# Patient Record
Sex: Female | Born: 1970
Health system: Southern US, Community
[De-identification: ages and names within clinical notes are randomized; demographics above are authoritative.]

## PROBLEM LIST (undated history)

## (undated) DIAGNOSIS — I10 Essential (primary) hypertension: Secondary | ICD-10-CM

## (undated) HISTORY — PX: MYOMECTOMY: SHX85

## (undated) HISTORY — PX: TUBAL LIGATION: SHX77

## (undated) HISTORY — PX: UTERINE FIBROID SURGERY: SHX826

---

## 2008-06-02 ENCOUNTER — Emergency Department (HOSPITAL_BASED_OUTPATIENT_CLINIC_OR_DEPARTMENT_OTHER): Admission: EM | Admit: 2008-06-02 | Discharge: 2008-06-02 | Payer: Self-pay | Admitting: Emergency Medicine

## 2008-06-02 ENCOUNTER — Ambulatory Visit: Payer: Self-pay | Admitting: Diagnostic Radiology

## 2009-04-06 ENCOUNTER — Emergency Department (HOSPITAL_BASED_OUTPATIENT_CLINIC_OR_DEPARTMENT_OTHER): Admission: EM | Admit: 2009-04-06 | Discharge: 2009-04-06 | Payer: Self-pay | Admitting: Emergency Medicine

## 2009-04-06 ENCOUNTER — Ambulatory Visit: Payer: Self-pay | Admitting: Diagnostic Radiology

## 2009-04-13 ENCOUNTER — Emergency Department (HOSPITAL_BASED_OUTPATIENT_CLINIC_OR_DEPARTMENT_OTHER): Admission: EM | Admit: 2009-04-13 | Discharge: 2009-04-13 | Payer: Self-pay | Admitting: Emergency Medicine

## 2009-10-29 ENCOUNTER — Emergency Department (HOSPITAL_BASED_OUTPATIENT_CLINIC_OR_DEPARTMENT_OTHER): Admission: EM | Admit: 2009-10-29 | Discharge: 2009-10-29 | Payer: Self-pay | Admitting: Emergency Medicine

## 2010-03-18 ENCOUNTER — Emergency Department (HOSPITAL_BASED_OUTPATIENT_CLINIC_OR_DEPARTMENT_OTHER)
Admission: EM | Admit: 2010-03-18 | Discharge: 2010-03-18 | Payer: Self-pay | Source: Home / Self Care | Admitting: Emergency Medicine

## 2010-06-16 LAB — PREGNANCY, URINE: Preg Test, Ur: NEGATIVE

## 2010-06-16 LAB — WET PREP, GENITAL: Yeast Wet Prep HPF POC: NONE SEEN

## 2010-06-16 LAB — URINALYSIS, ROUTINE W REFLEX MICROSCOPIC
Bilirubin Urine: NEGATIVE
Glucose, UA: NEGATIVE mg/dL
Ketones, ur: NEGATIVE mg/dL
pH: 7.5 (ref 5.0–8.0)

## 2010-06-16 LAB — GC/CHLAMYDIA PROBE AMP, GENITAL: Chlamydia, DNA Probe: NEGATIVE

## 2010-06-20 LAB — URINALYSIS, ROUTINE W REFLEX MICROSCOPIC
Bilirubin Urine: NEGATIVE
Glucose, UA: NEGATIVE mg/dL
Hgb urine dipstick: NEGATIVE
Ketones, ur: NEGATIVE mg/dL
Nitrite: NEGATIVE
Protein, ur: NEGATIVE mg/dL
Specific Gravity, Urine: 1.017 (ref 1.005–1.030)
Urobilinogen, UA: 0.2 mg/dL (ref 0.0–1.0)
pH: 7 (ref 5.0–8.0)

## 2010-06-20 LAB — LIPASE, BLOOD: Lipase: 87 U/L (ref 23–300)

## 2010-06-20 LAB — DIFFERENTIAL
Basophils Absolute: 0.1 10*3/uL (ref 0.0–0.1)
Basophils Relative: 2 % — ABNORMAL HIGH (ref 0–1)
Lymphocytes Relative: 33 % (ref 12–46)
Monocytes Absolute: 0.4 10*3/uL (ref 0.1–1.0)
Neutrophils Relative %: 53 % (ref 43–77)

## 2010-06-20 LAB — WET PREP, GENITAL
Trich, Wet Prep: NONE SEEN
Yeast Wet Prep HPF POC: NONE SEEN

## 2010-06-20 LAB — CBC
Hemoglobin: 13.8 g/dL (ref 12.0–15.0)
MCHC: 32.2 g/dL (ref 30.0–36.0)
MCV: 92.1 fL (ref 78.0–100.0)
RBC: 4.64 MIL/uL (ref 3.87–5.11)

## 2010-06-20 LAB — COMPREHENSIVE METABOLIC PANEL
AST: 25 U/L (ref 0–37)
CO2: 24 mEq/L (ref 19–32)
Calcium: 9.3 mg/dL (ref 8.4–10.5)
Creatinine, Ser: 0.8 mg/dL (ref 0.4–1.2)
GFR calc Af Amer: >60 mL/min (ref >60)
GFR calc non Af Amer: >60 mL/min (ref >60)
Glucose, Bld: 87 mg/dL (ref 70–99)
Total Protein: 9 g/dL — ABNORMAL HIGH (ref 6.0–8.3)

## 2010-06-20 LAB — PREGNANCY, URINE: Preg Test, Ur: NEGATIVE

## 2010-06-20 LAB — GC/CHLAMYDIA PROBE AMP, GENITAL: Chlamydia, DNA Probe: NEGATIVE

## 2010-06-21 LAB — URINALYSIS, ROUTINE W REFLEX MICROSCOPIC
Ketones, ur: NEGATIVE mg/dL
Nitrite: NEGATIVE
Nitrite: NEGATIVE
Protein, ur: NEGATIVE mg/dL
Specific Gravity, Urine: 1.023 (ref 1.005–1.030)
Urobilinogen, UA: 0.2 mg/dL (ref 0.0–1.0)
Urobilinogen, UA: 1 mg/dL (ref 0.0–1.0)
pH: 6.5 (ref 5.0–8.0)
pH: 6.5 (ref 5.0–8.0)

## 2010-06-21 LAB — DIFFERENTIAL
Basophils Relative: 0 % (ref 0–1)
Eosinophils Absolute: 0.1 10*3/uL (ref 0.0–0.7)
Eosinophils Relative: 2 % (ref 0–5)
Monocytes Relative: 12 % (ref 3–12)
Neutrophils Relative %: 55 % (ref 43–77)

## 2010-06-21 LAB — WET PREP, GENITAL: Yeast Wet Prep HPF POC: NONE SEEN

## 2010-06-21 LAB — CBC
HCT: 38.6 % (ref 36.0–46.0)
Hemoglobin: 12.8 g/dL (ref 12.0–15.0)
RBC: 4.16 MIL/uL (ref 3.87–5.11)
WBC: 3.6 10*3/uL — ABNORMAL LOW (ref 4.0–10.5)

## 2010-06-21 LAB — GC/CHLAMYDIA PROBE AMP, GENITAL
Chlamydia, DNA Probe: NEGATIVE
GC Probe Amp, Genital: NEGATIVE

## 2010-06-21 LAB — BASIC METABOLIC PANEL
CO2: 27 mEq/L (ref 19–32)
Calcium: 8.8 mg/dL (ref 8.4–10.5)
Chloride: 102 mEq/L (ref 96–112)
GFR calc Af Amer: >60 mL/min (ref >60)
Glucose, Bld: 68 mg/dL — ABNORMAL LOW (ref 70–99)
Sodium: 141 mEq/L (ref 135–145)

## 2010-06-21 LAB — PREGNANCY, URINE: Preg Test, Ur: NEGATIVE

## 2010-07-21 LAB — URINALYSIS, ROUTINE W REFLEX MICROSCOPIC
Leukocytes, UA: NEGATIVE
Nitrite: NEGATIVE
Protein, ur: NEGATIVE mg/dL
Urobilinogen, UA: 1 mg/dL (ref 0.0–1.0)

## 2010-07-21 LAB — URINE MICROSCOPIC-ADD ON

## 2011-01-02 IMAGING — CR DG CHEST 2V
2 series · 2 of 2 positions shown · non-contrast
Comparison: None

CLINICAL DATA: Cough, shortness of breath and fever.

CHEST - 2 VIEW

[w chest pa]
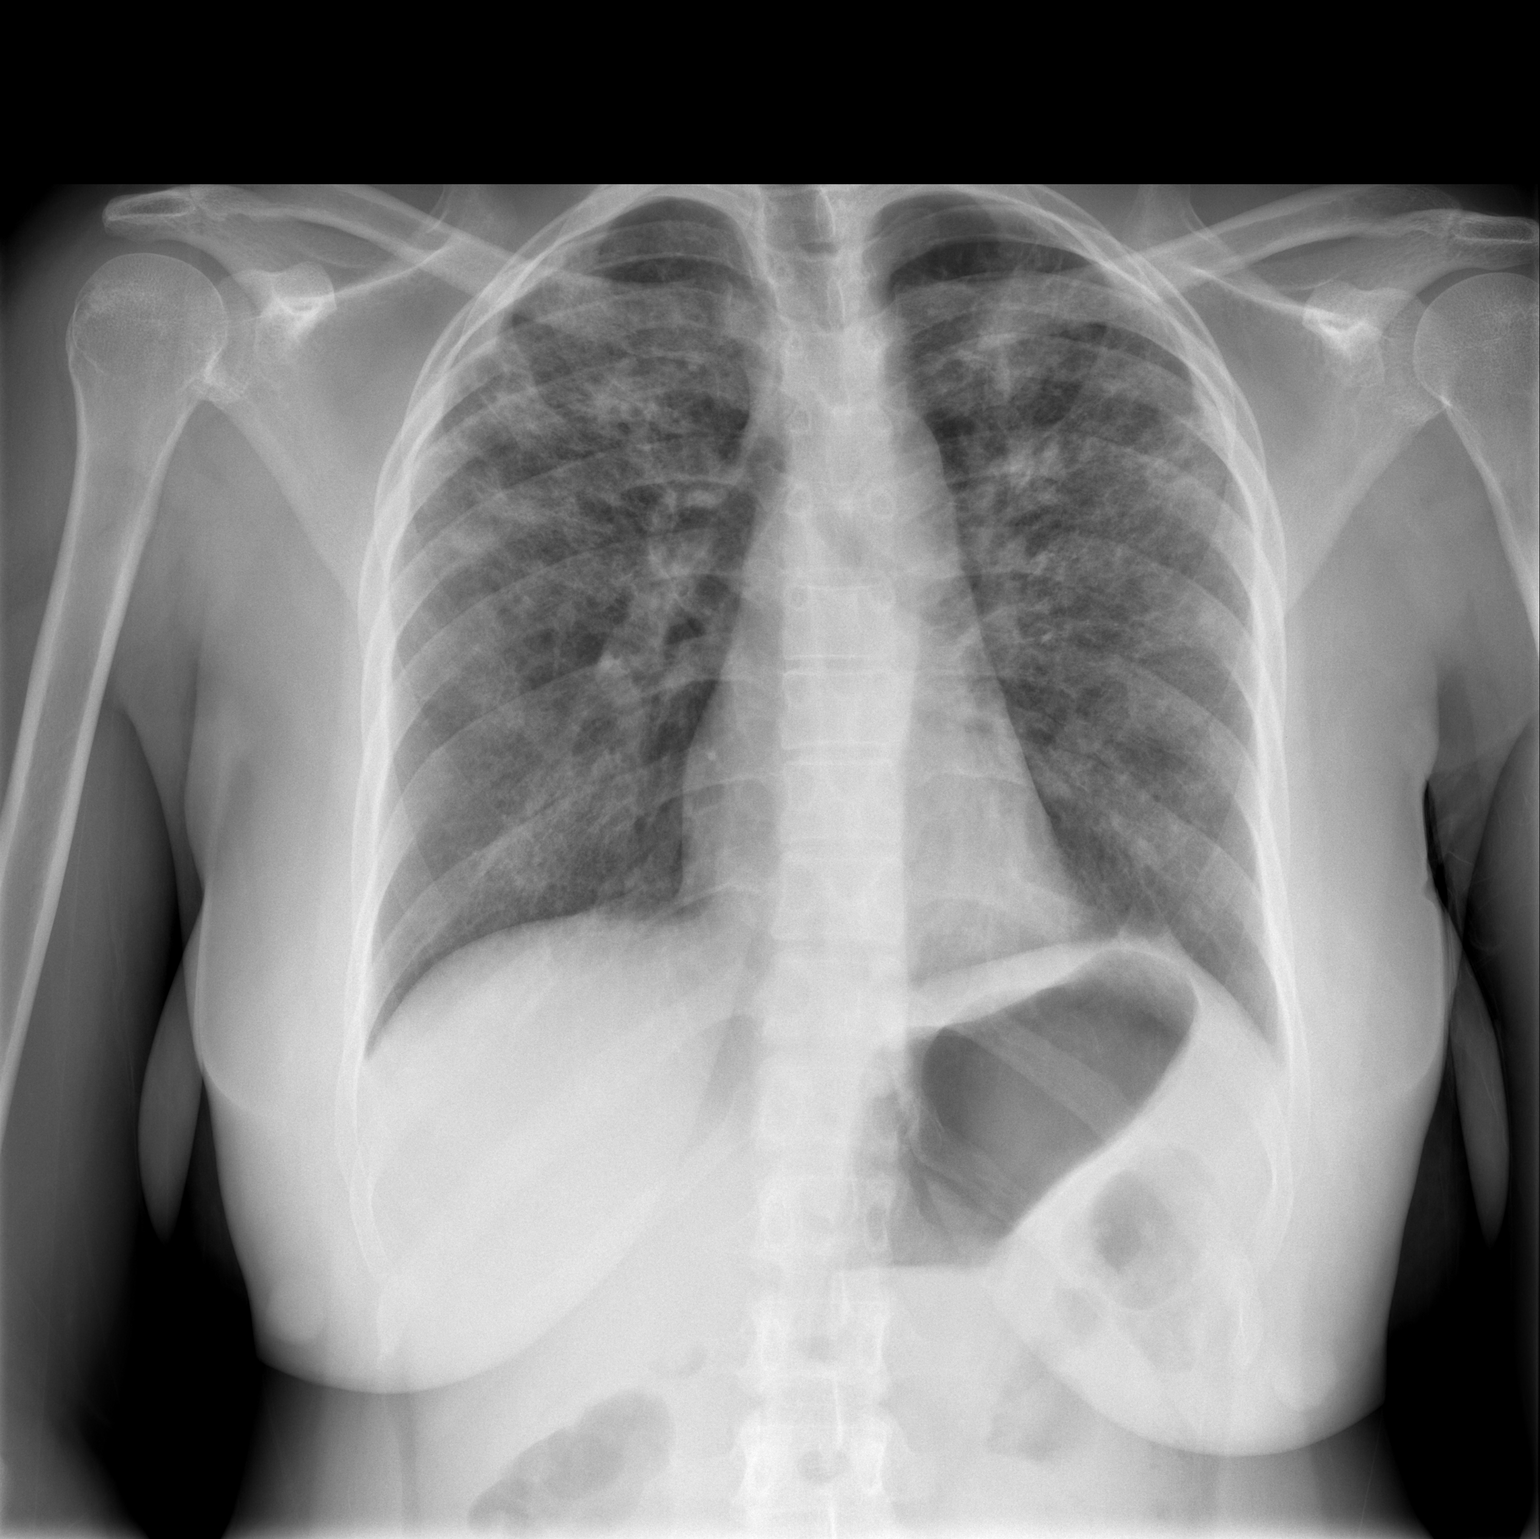

[w chest lat]
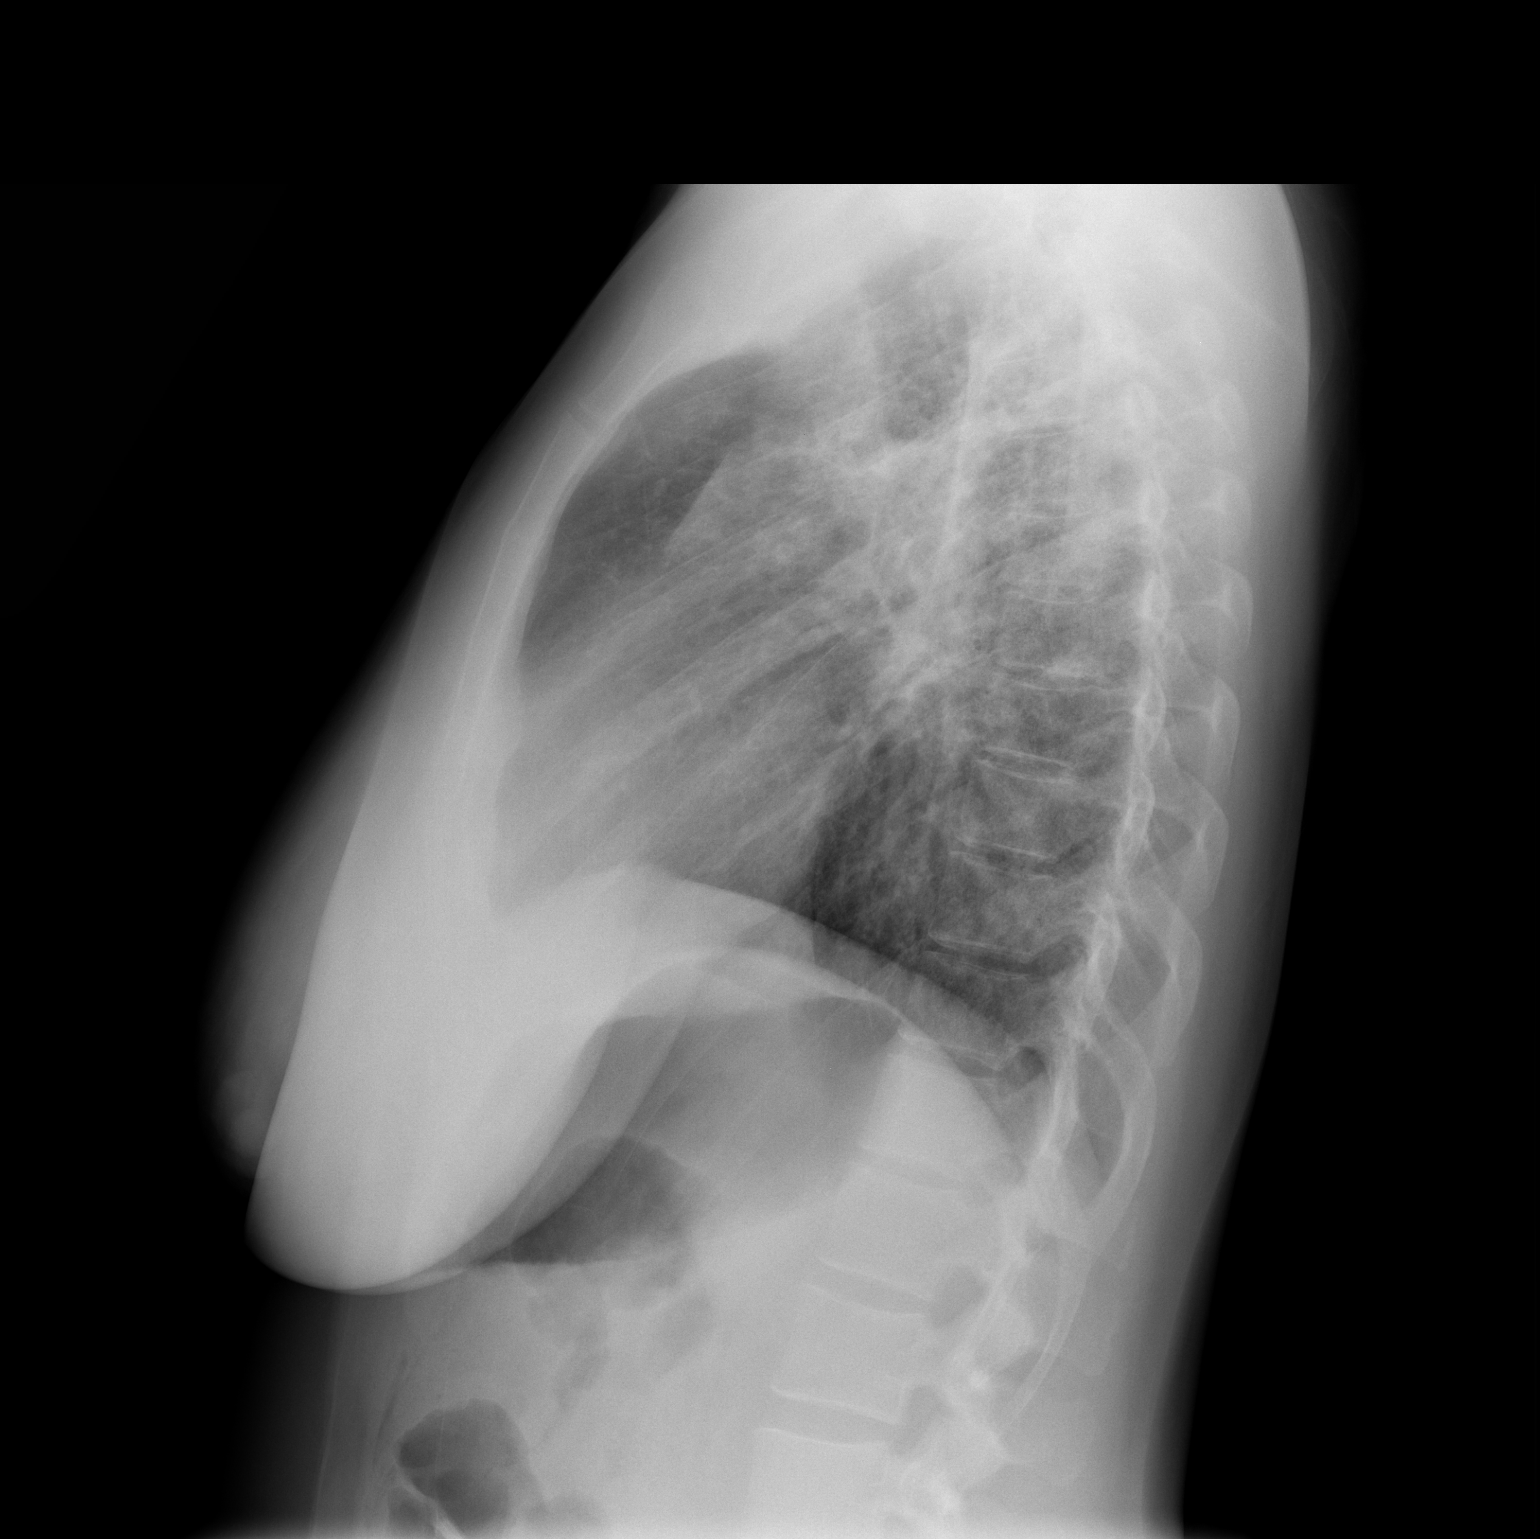

[2 of 2 positions shown; findings below may reference images not displayed]

FINDINGS: The cardiac silhouette, mediastinal and hilar contours
are within normal limits.  Bilateral upper lobe predominant
interstitial and airspace process may represent an atypical
pneumonia.  No pleural effusions or pulmonary edema.  The bony
thorax is intact.
IMPRESSION: Bilateral, upper lobe predominant, interstitial and airspace
process may reflect an atypical pneumonia.

## 2011-01-08 ENCOUNTER — Emergency Department (HOSPITAL_BASED_OUTPATIENT_CLINIC_OR_DEPARTMENT_OTHER)
Admission: EM | Admit: 2011-01-08 | Discharge: 2011-01-08 | Disposition: A | Discharge status: Home / Self Care | Payer: Medicaid Other | Attending: Emergency Medicine | Admitting: Emergency Medicine

## 2011-01-08 DIAGNOSIS — M549 Dorsalgia, unspecified: Secondary | ICD-10-CM

## 2011-01-08 DIAGNOSIS — F17200 Nicotine dependence, unspecified, uncomplicated: Secondary | ICD-10-CM | POA: Insufficient documentation

## 2011-01-08 LAB — BASIC METABOLIC PANEL
BUN: 10 mg/dL (ref 6–23)
CO2: 27 mEq/L (ref 19–32)
Calcium: 8.9 mg/dL (ref 8.4–10.5)
Chloride: 103 mEq/L (ref 96–112)
Creatinine, Ser: 0.8 mg/dL (ref 0.50–1.10)
GFR calc Af Amer: >90 mL/min (ref >90)
GFR calc non Af Amer: >90 mL/min (ref >90)
Glucose, Bld: 89 mg/dL (ref 70–99)
Potassium: 4 mEq/L (ref 3.5–5.1)
Sodium: 137 mEq/L (ref 135–145)

## 2011-01-08 LAB — URINALYSIS, ROUTINE W REFLEX MICROSCOPIC
Bilirubin Urine: NEGATIVE
Glucose, UA: NEGATIVE mg/dL
Ketones, ur: NEGATIVE mg/dL
Leukocytes, UA: NEGATIVE
pH: 7 (ref 5.0–8.0)

## 2011-01-08 MED ORDER — IBUPROFEN 400 MG PO TABS
400.0000 mg | ORAL_TABLET | Freq: Once | ORAL | Status: AC
  Administered 2011-01-08: 400 mg via ORAL
  Filled 2011-01-08: qty 1

## 2011-01-08 MED ORDER — OXYCODONE-ACETAMINOPHEN 5-325 MG PO TABS
1.0000 | ORAL_TABLET | Freq: Once | ORAL | Status: AC
  Administered 2011-01-08: 1 via ORAL
  Filled 2011-01-08: qty 1

## 2011-01-08 MED ORDER — DIAZEPAM 5 MG PO TABS
5.0000 mg | ORAL_TABLET | Freq: Once | ORAL | Status: AC
  Administered 2011-01-08: 5 mg via ORAL
  Filled 2011-01-08: qty 1

## 2011-01-08 MED ORDER — CYCLOBENZAPRINE HCL 10 MG PO TABS: 10.0000 mg | ORAL_TABLET | Freq: Two times a day (BID) | ORAL | Status: AC | PRN

## 2011-01-08 NOTE — ED Provider Notes (Signed)
History   40yF with R lower back pain. Onset this morning when coughing while standing in kitchen. Persistent since then. R low back without radiation. Worse with movement. No urinary complaints. No fever or chills. NO rash. No numbness, tingling or loss of strength. No abdominal pain. No n/v. Felt fine prior to event.  CSN: [id number] Arrival date & time: 01/08/2011  3:20 PM  Chief Complaint  Patient presents with  . Back Pain    (Consider location/radiation/quality/duration/timing/severity/associated sxs/prior treatment) HPI  History reviewed. No pertinent past medical history.  Past Surgical History  Procedure Date  . Uterine fibroid surgery     No family history on file.  History  Substance Use Topics  . Smoking status: Current Everyday Smoker  . Smokeless tobacco: Not on file  . Alcohol Use: No    OB History    Grav Para Term Preterm Abortions TAB SAB Ect Mult Living                  Review of Systems  All other systems reviewed and are negative.    Allergies  Review of patient's allergies indicates no known allergies.  Home Medications   Current Outpatient Rx  Name Route Sig Dispense Refill  . BC HEADACHE POWDER PO Oral Take 1 packet by mouth every 6 (six) hours as needed. For pain     . IBUPROFEN 200 MG PO TABS Oral Take 800 mg by mouth every 6 (six) hours as needed. For pain       BP 189/104  Pulse 75  Temp(Src) 98.5 F (36.9 C) (Oral)  Resp 18  Ht 5\' 4"  (1.626 m)  Wt 170 lb 4 oz (77.225 kg)  BMI 29.22 kg/m2  SpO2 98%  LMP 12/19/2010  Physical Exam  Constitutional: She appears well-developed and well-nourished.  HENT:  Head: Normocephalic and atraumatic.  Neck: Normal range of motion. Neck supple.  Cardiovascular: Normal rate, regular rhythm and normal heart sounds.  Exam reveals no friction rub.   No murmur heard. Pulmonary/Chest: Effort normal and breath sounds normal. No respiratory distress. She has no wheezes. She exhibits no  tenderness.  Abdominal: Soft. She exhibits no distension and no mass. There is no tenderness.  Musculoskeletal: Normal range of motion. She exhibits no edema and no tenderness.       Back pain not reproducble. No skin lesions noted.   Neurological: She is alert. No cranial nerve deficit. She exhibits normal muscle tone. Coordination normal.       Gait normal appearing and without evdience of ataxia. Strength 5/5 b/l LE. Sensation grossly intact to light touch.  Skin: Skin is warm and dry.  Psychiatric: Her behavior is normal. Thought content normal.    ED Course  Procedures (including critical care time)   Labs Reviewed  URINALYSIS, ROUTINE W REFLEX MICROSCOPIC  PREGNANCY, URINE  BASIC METABOLIC PANEL     No results found.   No diagnosis found.    MDM  40yF with atraumatic R lower back pain. Possible sprain/strain.  Consider ureteral colic or uti. Symptoms somewhat atypical and UA not suggestive. Location not consistent with biliary colic. Not pregnant. Also consider ovarian torsion but clinicl suspicion low. Plan symptomatic tx and outpt fu.        Raeford Razor, MD 01/15/11 1056

## 2011-01-08 NOTE — ED Notes (Signed)
Lower back pain started this am while standing-denies injury

## 2011-07-09 ENCOUNTER — Other Ambulatory Visit: Payer: Self-pay

## 2011-07-13 ENCOUNTER — Other Ambulatory Visit (HOSPITAL_COMMUNITY): Payer: Self-pay | Admitting: Obstetrics and Gynecology

## 2011-07-13 DIAGNOSIS — Z3682 Encounter for antenatal screening for nuchal translucency: Secondary | ICD-10-CM

## 2011-07-15 ENCOUNTER — Encounter (HOSPITAL_COMMUNITY): Payer: Self-pay

## 2011-07-15 ENCOUNTER — Other Ambulatory Visit (HOSPITAL_COMMUNITY): Payer: Self-pay | Admitting: Obstetrics and Gynecology

## 2011-07-15 ENCOUNTER — Ambulatory Visit (HOSPITAL_COMMUNITY)
Admission: RE | Admit: 2011-07-15 | Discharge: 2011-07-15 | Disposition: A | Discharge status: Home / Self Care | Payer: Medicaid Other | Source: Ambulatory Visit | Attending: Obstetrics and Gynecology | Admitting: Obstetrics and Gynecology

## 2011-07-15 ENCOUNTER — Other Ambulatory Visit: Payer: Self-pay | Admitting: Maternal and Fetal Medicine

## 2011-07-15 DIAGNOSIS — O09529 Supervision of elderly multigravida, unspecified trimester: Secondary | ICD-10-CM

## 2011-07-15 DIAGNOSIS — O3510X0 Maternal care for (suspected) chromosomal abnormality in fetus, unspecified, not applicable or unspecified: Secondary | ICD-10-CM | POA: Insufficient documentation

## 2011-07-15 DIAGNOSIS — Z3682 Encounter for antenatal screening for nuchal translucency: Secondary | ICD-10-CM

## 2011-07-15 DIAGNOSIS — Z3689 Encounter for other specified antenatal screening: Secondary | ICD-10-CM | POA: Insufficient documentation

## 2011-07-15 DIAGNOSIS — O3490 Maternal care for abnormality of pelvic organ, unspecified, unspecified trimester: Secondary | ICD-10-CM | POA: Insufficient documentation

## 2011-07-15 DIAGNOSIS — O99330 Smoking (tobacco) complicating pregnancy, unspecified trimester: Secondary | ICD-10-CM | POA: Insufficient documentation

## 2011-07-15 DIAGNOSIS — O351XX0 Maternal care for (suspected) chromosomal abnormality in fetus, not applicable or unspecified: Secondary | ICD-10-CM | POA: Insufficient documentation

## 2011-09-14 ENCOUNTER — Encounter (HOSPITAL_BASED_OUTPATIENT_CLINIC_OR_DEPARTMENT_OTHER): Payer: Self-pay | Admitting: *Deleted

## 2011-09-14 ENCOUNTER — Emergency Department (HOSPITAL_BASED_OUTPATIENT_CLINIC_OR_DEPARTMENT_OTHER)
Admission: EM | Admit: 2011-09-14 | Discharge: 2011-09-14 | Disposition: A | Discharge status: Home / Self Care | Payer: Medicaid Other | Attending: Emergency Medicine | Admitting: Emergency Medicine

## 2011-09-14 DIAGNOSIS — R51 Headache: Secondary | ICD-10-CM | POA: Insufficient documentation

## 2011-09-14 DIAGNOSIS — K089 Disorder of teeth and supporting structures, unspecified: Secondary | ICD-10-CM | POA: Insufficient documentation

## 2011-09-14 DIAGNOSIS — K0889 Other specified disorders of teeth and supporting structures: Secondary | ICD-10-CM

## 2011-09-14 DIAGNOSIS — F17200 Nicotine dependence, unspecified, uncomplicated: Secondary | ICD-10-CM | POA: Insufficient documentation

## 2011-09-14 MED ORDER — CLINDAMYCIN HCL 150 MG PO CAPS: 150.0000 mg | ORAL_CAPSULE | Freq: Four times a day (QID) | ORAL | Status: AC

## 2011-09-14 NOTE — ED Provider Notes (Signed)
History     CSN: [id number]  Arrival date & time 09/14/11  1432   First MD Initiated Contact with Patient 09/14/11 1444      Chief Complaint  Patient presents with  . Facial Pain    (Consider location/radiation/quality/duration/timing/severity/associated sxs/prior treatment) HPI Comments: Pt states that she has had right sided pain facial and ear pain for 1 week:pt states that she was seen and started on amoxicillin:pt states that she would like something more for pain:pt states that has not had fever  The history is provided by the patient. No language interpreter was used.    History reviewed. No pertinent past medical history.  Past Surgical History  Procedure Date  . Uterine fibroid surgery   . Myomectomy     No family history on file.  History  Substance Use Topics  . Smoking status: Current Everyday Smoker  . Smokeless tobacco: Not on file  . Alcohol Use: No    OB History    Grav Para Term Preterm Abortions TAB SAB Ect Mult Living   3 1 1  0 1 1 0 0 0 1      Review of Systems  Constitutional: Negative.   Respiratory: Negative.   Cardiovascular: Negative.     Allergies  Review of patient's allergies indicates no known allergies.  Home Medications   Current Outpatient Rx  Name Route Sig Dispense Refill  . BC HEADACHE POWDER PO Oral Take 1 packet by mouth every 6 (six) hours as needed. For pain     . CLINDAMYCIN HCL 150 MG PO CAPS Oral Take 1 capsule (150 mg total) by mouth every 6 (six) hours. 28 capsule 0  . IBUPROFEN 200 MG PO TABS Oral Take 800 mg by mouth every 6 (six) hours as needed. For pain       BP 108/61  Pulse 67  Temp(Src) 98 F (36.7 C) (Oral)  Resp 18  SpO2 99%  Physical Exam  Nursing note reviewed. Constitutional: She appears well-developed and well-nourished.  HENT:  Right Ear: External ear normal.  Left Ear: External ear normal.       Right lower jaw tender with mild swelling  Eyes: Conjunctivae and EOM are normal.    Cardiovascular: Normal rate and regular rhythm.   Pulmonary/Chest: Effort normal and breath sounds normal.    ED Course  Procedures (including critical care time)  Labs Reviewed - No data to display No results found.   1. Pain, dental       MDM  Will treat for dental pain:will switch to clindamycin        Teressa Lower, NP 09/14/11 [phone]

## 2011-09-14 NOTE — Discharge Instructions (Signed)
Toothache Toothaches are usually caused by tooth decay (cavity). However, other causes of toothache include:  Gum disease.   Cracked tooth.   Cracked filling.   Injury.   Jaw problem (temporo mandibular joint or TMJ disorder).   Tooth abscess.   Root sensitivity.   Grinding.   Eruption problems.  Swelling and redness around a painful tooth often means you have a dental abscess. Pain medicine and antibiotics can help reduce symptoms, but you will need to see a dentist within the next few days to have your problem properly evaluated and treated. If tooth decay is the problem, you may need a filling or root canal to save your tooth. If the problem is more severe, your tooth may need to be pulled. SEEK IMMEDIATE MEDICAL CARE IF:  You cannot swallow.   You develop severe swelling, increased redness, or increased pain in your mouth or face.   You have a fever.   You cannot open your mouth adequately.  Document Released: 04/29/2004 Document Revised: 03/11/2011 Document Reviewed: 06/19/2009 [employer] Patient Information 2012 [employer]. 

## 2011-09-14 NOTE — ED Notes (Signed)
Pt. Is pregnant and has been taking tylenol for pain with no relief

## 2011-09-14 NOTE — ED Notes (Signed)
Facial swelling x 1 week. Pain started in her right ear then her throat and now into the right side of her neck. She is 5 months pregnant and can only take Tylenol for pain with no relief.

## 2011-09-16 NOTE — ED Provider Notes (Signed)
Medical screening examination/treatment/procedure(s) were performed by non-physician practitioner and as supervising physician I was immediately available for consultation/collaboration.  Laylamarie Meuser, MD 09/16/11 1506 

## 2014-02-04 ENCOUNTER — Encounter (HOSPITAL_BASED_OUTPATIENT_CLINIC_OR_DEPARTMENT_OTHER): Payer: Self-pay | Admitting: *Deleted

## 2014-06-04 ENCOUNTER — Emergency Department (HOSPITAL_BASED_OUTPATIENT_CLINIC_OR_DEPARTMENT_OTHER): Payer: Medicaid Other

## 2014-06-04 ENCOUNTER — Emergency Department (HOSPITAL_BASED_OUTPATIENT_CLINIC_OR_DEPARTMENT_OTHER)
Admission: EM | Admit: 2014-06-04 | Discharge: 2014-06-04 | Disposition: A | Discharge status: Home / Self Care | Payer: Medicaid Other | Attending: Emergency Medicine | Admitting: Emergency Medicine

## 2014-06-04 ENCOUNTER — Encounter (HOSPITAL_BASED_OUTPATIENT_CLINIC_OR_DEPARTMENT_OTHER): Payer: Self-pay | Admitting: Emergency Medicine

## 2014-06-04 DIAGNOSIS — S8002XA Contusion of left knee, initial encounter: Secondary | ICD-10-CM | POA: Diagnosis not present

## 2014-06-04 DIAGNOSIS — Y9389 Activity, other specified: Secondary | ICD-10-CM | POA: Diagnosis not present

## 2014-06-04 DIAGNOSIS — S8992XA Unspecified injury of left lower leg, initial encounter: Secondary | ICD-10-CM | POA: Diagnosis present

## 2014-06-04 DIAGNOSIS — Y998 Other external cause status: Secondary | ICD-10-CM | POA: Insufficient documentation

## 2014-06-04 DIAGNOSIS — Y92410 Unspecified street and highway as the place of occurrence of the external cause: Secondary | ICD-10-CM | POA: Insufficient documentation

## 2014-06-04 DIAGNOSIS — Z720 Tobacco use: Secondary | ICD-10-CM | POA: Insufficient documentation

## 2014-06-04 MED ORDER — NAPROXEN 500 MG PO TABS: 500.0000 mg | ORAL_TABLET | Freq: Two times a day (BID) | ORAL | Status: DC

## 2014-06-04 MED ORDER — IBUPROFEN 400 MG PO TABS
600.0000 mg | ORAL_TABLET | Freq: Once | ORAL | Status: AC
  Administered 2014-06-04: 600 mg via ORAL
  Filled 2014-06-04 (×2): qty 1
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
## 2014-06-04 NOTE — Discharge Instructions (Signed)
Keep knee elevated. Ice several times a day. Knee sleeve for support. Follow up with your doctor or urgent care if not improving.   Knee Pain The knee is the complex joint between your thigh and your lower leg. It is made up of bones, tendons, ligaments, and cartilage. The bones that make up the knee are:  The femur in the thigh.  The tibia and fibula in the lower leg.  The patella or kneecap riding in the groove on the lower femur. CAUSES  Knee pain is a common complaint with many causes. A few of these causes are:  Injury, such as:  A ruptured ligament or tendon injury.  Torn cartilage.  Medical conditions, such as:  Gout  Arthritis  Infections  Overuse, over training, or overdoing a physical activity. Knee pain can be minor or severe. Knee pain can accompany debilitating injury. Minor knee problems often respond well to self-care measures or get well on their own. More serious injuries may need medical intervention or even surgery. SYMPTOMS The knee is complex. Symptoms of knee problems can vary widely. Some of the problems are:  Pain with movement and weight bearing.  Swelling and tenderness.  Buckling of the knee.  Inability to straighten or extend your knee.  Your knee locks and you cannot straighten it.  Warmth and redness with pain and fever.  Deformity or dislocation of the kneecap. DIAGNOSIS  Determining what is wrong may be very straight forward such as when there is an injury. It can also be challenging because of the complexity of the knee. Tests to make a diagnosis may include:  Your caregiver taking a history and doing a physical exam.  Routine X-rays can be used to rule out other problems. X-rays will not reveal a cartilage tear. Some injuries of the knee can be diagnosed by:  Arthroscopy a surgical technique by which a small video camera is inserted through tiny incisions on the sides of the knee. This procedure is used to examine and repair  internal knee joint problems. Tiny instruments can be used during arthroscopy to repair the torn knee cartilage (meniscus).  Arthrography is a radiology technique. A contrast liquid is directly injected into the knee joint. Internal structures of the knee joint then become visible on X-ray film.  An MRI scan is a non X-ray radiology procedure in which magnetic fields and a computer produce two- or three-dimensional images of the inside of the knee. Cartilage tears are often visible using an MRI scanner. MRI scans have largely replaced arthrography in diagnosing cartilage tears of the knee.  Blood work.  Examination of the fluid that helps to lubricate the knee joint (synovial fluid). This is done by taking a sample out using a needle and a syringe. TREATMENT The treatment of knee problems depends on the cause. Some of these treatments are:  Depending on the injury, proper casting, splinting, surgery, or physical therapy care will be needed.  Give yourself adequate recovery time. Do not overuse your joints. If you begin to get sore during workout routines, back off. Slow down or do fewer repetitions.  For repetitive activities such as cycling or running, maintain your strength and nutrition.  Alternate muscle groups. For example, if you are a weight lifter, work the upper body on one day and the lower body the next.  Either tight or weak muscles do not give the proper support for your knee. Tight or weak muscles do not absorb the stress placed on the knee joint.  Keep the muscles surrounding the knee strong.  Take care of mechanical problems.  If you have flat feet, orthotics or special shoes may help. See your caregiver if you need help.  Arch supports, sometimes with wedges on the inner or outer aspect of the heel, can help. These can shift pressure away from the side of the knee most bothered by osteoarthritis.  A brace called an "unloader" brace also may be used to help ease the  pressure on the most arthritic side of the knee.  If your caregiver has prescribed crutches, braces, wraps or ice, use as directed. The acronym for this is PRICE. This means protection, rest, ice, compression, and elevation.  Nonsteroidal anti-inflammatory drugs (NSAIDs), can help relieve pain. But if taken immediately after an injury, they may actually increase swelling. Take NSAIDs with food in your stomach. Stop them if you develop stomach problems. Do not take these if you have a history of ulcers, stomach pain, or bleeding from the bowel. Do not take without your caregiver's approval if you have problems with fluid retention, heart failure, or kidney problems.  For ongoing knee problems, physical therapy may be helpful.  Glucosamine and chondroitin are over-the-counter dietary supplements. Both may help relieve the pain of osteoarthritis in the knee. These medicines are different from the usual anti-inflammatory drugs. Glucosamine may decrease the rate of cartilage destruction.  Injections of a corticosteroid drug into your knee joint may help reduce the symptoms of an arthritis flare-up. They may provide pain relief that lasts a few months. You may have to wait a few months between injections. The injections do have a small increased risk of infection, water retention, and elevated blood sugar levels.  Hyaluronic acid injected into damaged joints may ease pain and provide lubrication. These injections may work by reducing inflammation. A series of shots may give relief for as long as 6 months.  Topical painkillers. Applying certain ointments to your skin may help relieve the pain and stiffness of osteoarthritis. Ask your pharmacist for suggestions. Many over the-counter products are approved for temporary relief of arthritis pain.  In some countries, doctors often prescribe topical NSAIDs for relief of chronic conditions such as arthritis and tendinitis. A review of treatment with NSAID creams  found that they worked as well as oral medications but without the serious side effects. PREVENTION  Maintain a healthy weight. Extra pounds put more strain on your joints.  Get strong, stay limber. Weak muscles are a common cause of knee injuries. Stretching is important. Include flexibility exercises in your workouts.  Be smart about exercise. If you have osteoarthritis, chronic knee pain or recurring injuries, you may need to change the way you exercise. This does not mean you have to stop being active. If your knees ache after jogging or playing basketball, consider switching to swimming, water aerobics, or other low-impact activities, at least for a few days a week. Sometimes limiting high-impact activities will provide relief.  Make sure your shoes fit well. Choose footwear that is right for your sport.  Protect your knees. Use the proper gear for knee-sensitive activities. Use kneepads when playing volleyball or laying carpet. Buckle your seat belt every time you drive. Most shattered kneecaps occur in car accidents.  Rest when you are tired. SEEK MEDICAL CARE IF:  You have knee pain that is continual and does not seem to be getting better.  SEEK IMMEDIATE MEDICAL CARE IF:  Your knee joint feels hot to the touch and you have a  high fever. MAKE SURE YOU:   Understand these instructions.  Will watch your condition.  Will get help right away if you are not doing well or get worse. Document Released: 01/17/2007 Document Revised: 06/14/2011 Document Reviewed: 01/17/2007 [facility] Patient Information 2015 [place], [place]. This information is not intended to replace advice given to you by your health care provider. Make sure you discuss any questions you have with your health care provider.
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
## 2014-07-28 ENCOUNTER — Encounter (HOSPITAL_BASED_OUTPATIENT_CLINIC_OR_DEPARTMENT_OTHER): Payer: Self-pay | Admitting: *Deleted

## 2014-07-28 ENCOUNTER — Emergency Department (HOSPITAL_BASED_OUTPATIENT_CLINIC_OR_DEPARTMENT_OTHER)
Admission: EM | Admit: 2014-07-28 | Discharge: 2014-07-28 | Disposition: A | Discharge status: Home / Self Care | Payer: Medicaid Other | Attending: Emergency Medicine | Admitting: Emergency Medicine

## 2014-07-28 DIAGNOSIS — Z720 Tobacco use: Secondary | ICD-10-CM | POA: Insufficient documentation

## 2014-07-28 DIAGNOSIS — Z791 Long term (current) use of non-steroidal anti-inflammatories (NSAID): Secondary | ICD-10-CM | POA: Insufficient documentation

## 2014-07-28 DIAGNOSIS — M545 Low back pain, unspecified: Secondary | ICD-10-CM

## 2014-07-28 MED ORDER — IBUPROFEN 800 MG PO TABS: 800.0000 mg | ORAL_TABLET | Freq: Three times a day (TID) | ORAL | Status: DC

## 2014-07-28 MED ORDER — HYDROCODONE-ACETAMINOPHEN 5-325 MG PO TABS: 2.0000 | ORAL_TABLET | ORAL | Status: DC | PRN

## 2014-07-28 MED ORDER — CYCLOBENZAPRINE HCL 10 MG PO TABS: 10.0000 mg | ORAL_TABLET | Freq: Three times a day (TID) | ORAL | Status: DC | PRN

## 2014-07-28 NOTE — Discharge Instructions (Signed)
Back Pain, Adult °Low back pain is very common. About 1 in 5 people have back pain. The cause of low back pain is rarely dangerous. The pain often gets better over time. About half of people with a sudden onset of back pain feel better in just 2 weeks. About 8 in 10 people feel better by 6 weeks.  °CAUSES °Some common causes of back pain include: °· Strain of the muscles or ligaments supporting the spine. °· Wear and tear (degeneration) of the spinal discs. °· Arthritis. °· Direct injury to the back. °DIAGNOSIS °Most of the time, the direct cause of low back pain is not known. However, back pain can be treated effectively even when the exact cause of the pain is unknown. Answering your caregiver's questions about your overall health and symptoms is one of the most accurate ways to make sure the cause of your pain is not dangerous. If your caregiver needs more information, he or she may order lab work or imaging tests (X-rays or MRIs). However, even if imaging tests show changes in your back, this usually does not require surgery. °HOME CARE INSTRUCTIONS °For many people, back pain returns. Since low back pain is rarely dangerous, it is often a condition that people can learn to manage on their own.  °· Remain active. It is stressful on the back to sit or stand in one place. Do not sit, drive, or stand in one place for more than 30 minutes at a time. Take short walks on level surfaces as soon as pain allows. Try to increase the length of time you walk each day. °· Do not stay in bed. Resting more than 1 or 2 days can delay your recovery. °· Do not avoid exercise or work. Your body is made to move. It is not dangerous to be active, even though your back may hurt. Your back will likely heal faster if you return to being active before your pain is gone. °· Pay attention to your body when you  bend and lift. Many people have less discomfort when lifting if they bend their knees, keep the load close to their bodies, and  avoid twisting. Often, the most comfortable positions are those that put less stress on your recovering back. °· Find a comfortable position to sleep. Use a firm mattress and lie on your side with your knees slightly bent. If you lie on your back, put a pillow under your knees. °· Only take over-the-counter or prescription medicines as directed by your caregiver. Over-the-counter medicines to reduce pain and inflammation are often the most helpful. Your caregiver may prescribe muscle relaxant drugs. These medicines help dull your pain so you can more quickly return to your normal activities and healthy exercise. °· Put ice on the injured area. °· Put ice in a plastic bag. °· Place a towel between your skin and the bag. °· Leave the ice on for 15-20 minutes, 03-04 times a day for the first 2 to 3 days. After that, ice and heat may be alternated to reduce pain and spasms. °· Ask your caregiver about trying back exercises and gentle massage. This may be of some benefit. °· Avoid feeling anxious or stressed. Stress increases muscle tension and can worsen back pain. It is important to recognize when you are anxious or stressed and learn ways to manage it. Exercise is a great option. °SEEK MEDICAL CARE IF: °· You have pain that is not relieved with rest or medicine. °· You have pain that does not improve in 1 week. °· You have new symptoms. °· You are generally not feeling well. °SEEK   IMMEDIATE MEDICAL CARE IF:  °· You have pain that radiates from your back into your legs. °· You develop new bowel or bladder control problems. °· You have unusual weakness or numbness in your arms or legs. °· You develop nausea or vomiting. °· You develop abdominal pain. °· You feel faint. °Document Released: 03/22/2005 Document Revised: 09/21/2011 Document Reviewed: 07/24/2013 °[employer]® Patient Information ©2015 [employer]. This information is not intended to replace advice given to you by your health care provider. Make sure you  discuss any questions you have with your health care provider. ° °Back Injury Prevention °Back injuries can be extremely painful and difficult to heal. After having one back injury, you are much more likely to experience another later on. It is important to learn how to avoid injuring or re-injuring your back. The following tips can help you to prevent a back injury. °PHYSICAL FITNESS °· Exercise regularly and try to develop good tone in your abdominal muscles. Your abdominal muscles provide a lot of the support needed by your back. °· Do aerobic exercises (walking, jogging, biking, swimming) regularly. °· Do exercises that increase balance and strength (tai chi, yoga) regularly. This can decrease your risk of falling and injuring your back. °· Stretch before and after exercising. °· Maintain a healthy weight. The more you weigh, the more stress is placed on your back. For every pound of weight, 10 times that amount of pressure is placed on the back. °DIET °· Talk to your caregiver about how much calcium and vitamin D you need per day. These nutrients help to prevent weakening of the bones (osteoporosis). Osteoporosis can cause broken (fractured) bones that lead to back pain. °· Include good sources of calcium in your diet, such as dairy products, green, leafy vegetables, and products with calcium added (fortified). °· Include good sources of vitamin D in your diet, such as milk and foods that are fortified with vitamin D. °· Consider taking a nutritional supplement or a multivitamin if needed. °· Stop smoking if you smoke. °POSTURE °· Sit and stand up straight. Avoid leaning forward when you sit or hunching over when you stand. °· Choose chairs with good low back (lumbar) support. °· If you work at a desk, sit close to your work so you do not need to lean over. Keep your chin tucked in. Keep your neck drawn back and elbows bent at a right angle. Your arms should look like the letter "L." °· Sit high and close to  the steering wheel when you drive. Add a lumbar support to your car seat if needed. °· Avoid sitting or standing in one position for too long. Take breaks to get up, stretch, and walk around at least once every hour. Take breaks if you are driving for long periods of time. °· Sleep on your side with your knees slightly bent, or sleep on your back with a pillow under your knees. Do not sleep on your stomach. °LIFTING, TWISTING, AND REACHING °· Avoid heavy lifting, especially repetitive lifting. If you must do heavy lifting: °¨ Stretch before lifting. °¨ Work slowly. °¨ Rest between lifts. °¨ Use carts and dollies to move objects when possible. °¨ Make several small trips instead of carrying 1 heavy load. °¨ Ask for help when you need it. °¨ Ask for help when moving big, awkward objects. °· Follow these steps when lifting: °¨ Stand with your feet shoulder-width apart. °¨ Get as close to the object as you can. Do not try to   pick up heavy objects that are far from your body.  Use handles or lifting straps if they are available.  Bend at your knees. Squat down, but keep your heels off the floor.  Keep your shoulders pulled back, your chin tucked in, and your back straight.  Lift the object slowly, tightening the muscles in your legs, abdomen, and buttocks. Keep the object as close to the center of your body as possible.  When you put a load down, use these same guidelines in reverse.  Do not:  Lift the object above your waist.  Twist at the waist while lifting or carrying a load. Move your feet if you need to turn, not your waist.  Bend over without bending at your knees.  Avoid reaching over your head, across a table, or for an object on a high surface. OTHER TIPS  Avoid wet floors and keep sidewalks clear of ice to prevent falls.  Do not sleep on a mattress that is too soft or too hard.  Keep items that are used frequently within easy reach.  Put heavier objects on shelves at waist level  and lighter objects on lower or higher shelves.  Find ways to decrease your stress, such as exercise, massage, or relaxation techniques. Stress can build up in your muscles. Tense muscles are more vulnerable to injury.  Seek treatment for depression or anxiety if needed. These conditions can increase your risk of developing back pain. SEEK MEDICAL CARE IF:  You injure your back.  You have questions about diet, exercise, or other ways to prevent back injuries. MAKE SURE YOU:  Understand these instructions.  Will watch your condition.  Will get help right away if you are not doing well or get worse. Document Released: 04/29/2004 Document Revised: 06/14/2011 Document Reviewed: 05/03/2011 [facility] Patient Information 2015 [place], [place]. This information is not intended to replace advice given to you by your health care provider. Make sure you discuss any questions you have with your health care provider.

## 2014-07-28 NOTE — ED Provider Notes (Signed)
CSN: [id number]     Arrival date & time 07/28/14  4098 History   First MD Initiated Contact with Patient 07/28/14 [phone]     Chief Complaint  Patient presents with  . Back Pain     (Consider location/radiation/quality/duration/timing/severity/associated sxs/prior Treatment) HPI Comments: Patient presents to the ER for evaluation of low back pain. Patient reports that she has been experiencing pain for 3 days. She thinks she injured herself lifting boxes. Patient reports constant aching pain in the lower back that worsens with movement of the back. Pain does not radiate. No numbness, tingling, weakness of lower extremities. No change of bowel or bladder function.  Patient is a 44 y.o. female presenting with back pain.  Back Pain   History reviewed. No pertinent past medical history. Past Surgical History  Procedure Laterality Date  . Uterine fibroid surgery    . Myomectomy     History reviewed. No pertinent family history. History  Substance Use Topics  . Smoking status: Current Every Day Smoker -- 0.50 packs/day    Types: Cigarettes  . Smokeless tobacco: Not on file  . Alcohol Use: Yes     Comment: occassionally    OB History    Gravida Para Term Preterm AB TAB SAB Ectopic Multiple Living   0 1 1 0 0 0 1     Review of Systems  Genitourinary: Negative.   Musculoskeletal: Positive for back pain.  Neurological: Negative.   All other systems reviewed and are negative.     Allergies  Review of patient's allergies indicates no known allergies.  Home Medications   Prior to Admission medications   Medication Sig Start Date End Date Taking? Authorizing Provider  cyclobenzaprine (FLEXERIL) 10 MG tablet Take 1 tablet (10 mg total) by mouth 3 (three) times daily as needed for muscle spasms. 07/28/14   Gilda Crease, MD  HYDROcodone-acetaminophen (NORCO/VICODIN) 5-325 MG per tablet Take 2 tablets by mouth every 4 (four) hours as needed for moderate pain. 07/28/14    Gilda Crease, MD  ibuprofen (ADVIL,MOTRIN) 800 MG tablet Take 1 tablet (800 mg total) by mouth 3 (three) times daily. 07/28/14   Gilda Crease, MD  naproxen (NAPROSYN) 500 MG tablet Take 1 tablet (500 mg total) by mouth 2 (two) times daily. 06/04/14   Tatyana Kirichenko, PA-C   BP 125/84 mmHg  Pulse 71  Temp(Src) 98.3 F (36.8 C) (Oral)  Resp 16  Ht  (1.626 m)  Wt 171 lb (77.565 kg)  BMI 29.34 kg/m2  SpO2 98%  LMP 07/05/2014 Physical Exam  Constitutional: She is oriented to person, place, and time. She appears well-developed and well-nourished. No distress.  HENT:  Head: Normocephalic and atraumatic.  Right Ear: Hearing normal.  Left Ear: Hearing normal.  Nose: Nose normal.  Mouth/Throat: Oropharynx is clear and moist and mucous membranes are normal.  Eyes: Conjunctivae and EOM are normal. Pupils are equal, round, and reactive to light.  Neck: Normal range of motion. Neck supple.  Cardiovascular: Regular rhythm, S1 normal and S2 normal.  Exam reveals no gallop and no friction rub.   No murmur heard. Pulmonary/Chest: Effort normal and breath sounds normal. No respiratory distress. She exhibits no tenderness.  Abdominal: Soft. Normal appearance and bowel sounds are normal. There is no hepatosplenomegaly. There is no tenderness. There is no rebound, no guarding, no tenderness at McBurney's point and negative Murphy's sign. No hernia.  Musculoskeletal: Normal range of motion.  Back:  Neurological: She is alert and oriented to person, place, and time. She has normal strength. No cranial nerve deficit or sensory deficit. Coordination normal. GCS eye subscore is 4. GCS verbal subscore is 5. GCS motor subscore is 6.  Reflex Scores:      Patellar reflexes are 1+ on the right side and 1+ on the left side. Skin: Skin is warm, dry and intact. No rash noted. No cyanosis.  Psychiatric: She has a normal mood and affect. Her speech is normal and behavior is normal. Thought  content normal.  Nursing note and vitals reviewed.   ED Course  Procedures (including critical care time) Labs Review Labs Reviewed - No data to display  Imaging Review No results found.   EKG Interpretation None      MDM   Final diagnoses:  Bilateral low back pain without sciatica    Patient presents to the ER with musculoskeletal back pain. Examination reveals back tenderness without any associated neurologic findings. Patient's strength, sensation and reflexes were normal. As such, patient did not require any imaging or further studies. Patient was treated with analgesia.  Gilda Creasehristopher J Pollina, MD 07/28/14 [phone]

## 2014-07-28 NOTE — ED Notes (Signed)
Pt c/o lower back pain with movt  X 3 days

## 2015-01-08 ENCOUNTER — Emergency Department (HOSPITAL_BASED_OUTPATIENT_CLINIC_OR_DEPARTMENT_OTHER)
Admission: EM | Admit: 2015-01-08 | Discharge: 2015-01-08 | Disposition: A | Discharge status: Home / Self Care | Payer: Medicaid Other | Attending: Emergency Medicine | Admitting: Emergency Medicine

## 2015-01-08 ENCOUNTER — Encounter (HOSPITAL_BASED_OUTPATIENT_CLINIC_OR_DEPARTMENT_OTHER): Payer: Self-pay | Admitting: Emergency Medicine

## 2015-01-08 DIAGNOSIS — Z791 Long term (current) use of non-steroidal anti-inflammatories (NSAID): Secondary | ICD-10-CM | POA: Insufficient documentation

## 2015-01-08 DIAGNOSIS — I10 Essential (primary) hypertension: Secondary | ICD-10-CM | POA: Diagnosis not present

## 2015-01-08 DIAGNOSIS — Z720 Tobacco use: Secondary | ICD-10-CM | POA: Diagnosis not present

## 2015-01-08 DIAGNOSIS — M545 Low back pain, unspecified: Secondary | ICD-10-CM

## 2015-01-08 HISTORY — DX: Essential (primary) hypertension: I10

## 2015-01-08 MED ORDER — HYDROCODONE-ACETAMINOPHEN 5-325 MG PO TABS: 1.0000 | ORAL_TABLET | Freq: Four times a day (QID) | ORAL | Status: DC | PRN

## 2015-01-08 MED ORDER — IBUPROFEN 800 MG PO TABS: 800.0000 mg | ORAL_TABLET | Freq: Three times a day (TID) | ORAL | Status: DC

## 2015-01-08 MED ORDER — CYCLOBENZAPRINE HCL 10 MG PO TABS: 10.0000 mg | ORAL_TABLET | Freq: Three times a day (TID) | ORAL | Status: DC | PRN

## 2015-01-08 NOTE — ED Notes (Signed)
Pt reports lower back pain after lifting a dresser, left > right, denies urinary symptoms

## 2015-01-08 NOTE — Discharge Instructions (Signed)

## 2015-01-08 NOTE — ED Provider Notes (Signed)
CSN: [id number]     Arrival date & time 01/08/15  1216 History   First MD Initiated Contact with Patient 01/08/15 1225     Chief Complaint  Patient presents with  . Back Pain   HPI   44 year old female presents today with left lower back pain. Patient reports yesterday she was helping her son move a Child psychotherapist when she felt a sharp pain in her left lower back. Pain does not radiate, has persisted, made worse with palpation and forward flexion of the lower back. Patient denies loss of lower extremity strength, sensation, function. Reports that she's been using ibuprofen and ice at home with no relief of symptoms. Patient reports this happened once previously approximately 6 months ago, that resolved with oral medications in approximately 2 weeks of rest. Patient reports that she does not have baseline back pain.   Past Medical History  Diagnosis Date  . Hypertension    Past Surgical History  Procedure Laterality Date  . Uterine fibroid surgery    . Myomectomy     History reviewed. No pertinent family history. Social History  Substance Use Topics  . Smoking status: Current Every Day Smoker -- 0.50 packs/day    Types: Cigarettes  . Smokeless tobacco: None  . Alcohol Use: Yes     Comment: occassionally    OB History    Gravida Para Term Preterm AB TAB SAB Ectopic Multiple Living   0 1 1 0 0 0 1     Review of Systems  All other systems reviewed and are negative.   Allergies  Review of patient's allergies indicates no known allergies.  Home Medications   Prior to Admission medications   Medication Sig Start Date End Date Taking? Authorizing Provider  cyclobenzaprine (FLEXERIL) 10 MG tablet Take 1 tablet (10 mg total) by mouth 3 (three) times daily as needed for muscle spasms. 01/08/15   Eyvonne Mechanic, PA-C  HYDROcodone-acetaminophen (NORCO/VICODIN) 5-325 MG tablet Take 1 tablet by mouth every 6 (six) hours as needed. 01/08/15   Eyvonne Mechanic, PA-C  ibuprofen (ADVIL,MOTRIN)  800 MG tablet Take 1 tablet (800 mg total) by mouth 3 (three) times daily. 01/08/15   Eyvonne Mechanic, PA-C  naproxen (NAPROSYN) 500 MG tablet Take 1 tablet (500 mg total) by mouth 2 (two) times daily. 06/04/14   Tatyana Kirichenko, PA-C   BP 148/93 mmHg  Pulse 88  Temp(Src) 98.6 F (37 C) (Oral)  Resp 18  Ht  (1.676 m)  Wt 176 lb (79.833 kg)  BMI 28.42 kg/m2  SpO2 100%  LMP 12/17/2014  Breastfeeding? No   Physical Exam  Constitutional: She is oriented to person, place, and time. She appears well-developed and well-nourished. No distress.  HENT:  Head: Normocephalic.  Neck: Normal range of motion. Neck supple.  Pulmonary/Chest: Effort normal.  Musculoskeletal: Normal range of motion. She exhibits tenderness. She exhibits no edema.  No C, T, or L spine tenderness to palpation. No obvious signs of trauma, deformity, infection, step-offs. Lung expansion normal. No scoliosis or kyphosis. Bilateral lower extremity strength 5 out of 5, sensation grossly intact, patellar reflexes 2+, pedal pulses 2+, Refill less than 3 seconds. Patient has minor tenderness to palpation of the left lower lumbar soft tissue  Straight leg negative Ambulates with minimal difficulty   Neurological: She is alert and oriented to person, place, and time.  Skin: Skin is warm and dry. She is not diaphoretic.  Psychiatric: She has a normal mood and affect. Her behavior  is normal. Judgment and thought content normal.  Nursing note and vitals reviewed.   ED Course  Procedures (including critical care time) Labs Review Labs Reviewed - No data to display  Imaging Review No results found. I have personally reviewed and evaluated these images and lab results as part of my medical decision-making.   EKG Interpretation None      MDM   Final diagnoses:  Left-sided low back pain without sciatica    Labs:  Imaging:  Consults:  Therapeutics:  Discharge Meds: Flexeril, ibuprofen,  hydrocodone  Assessment/Plan: Patient presents with uncompensated back pain. Patient has no chronic back pain, this is an acute episode, she has no red flags, ambulates with minimal pain or difficulty. No need for further evaluation or management here in the ED setting. Patient will be discharged home with above medications, encouraged to follow up with primary care if symptoms continue to persist, return here if they worsen. She verbalized understanding and agreement for today's plan and had no further questions concerns.         Eyvonne Mechanic, PA-C 01/08/15 1256  Rolland Porter, MD 01/11/15 1946

## 2015-01-18 ENCOUNTER — Emergency Department (HOSPITAL_BASED_OUTPATIENT_CLINIC_OR_DEPARTMENT_OTHER)
Admission: EM | Admit: 2015-01-18 | Discharge: 2015-01-18 | Disposition: A | Discharge status: Home / Self Care | Payer: Medicaid Other | Attending: Physician Assistant | Admitting: Physician Assistant

## 2015-01-18 ENCOUNTER — Encounter (HOSPITAL_BASED_OUTPATIENT_CLINIC_OR_DEPARTMENT_OTHER): Payer: Self-pay | Admitting: *Deleted

## 2015-01-18 DIAGNOSIS — I10 Essential (primary) hypertension: Secondary | ICD-10-CM | POA: Insufficient documentation

## 2015-01-18 DIAGNOSIS — M545 Low back pain: Secondary | ICD-10-CM | POA: Diagnosis present

## 2015-01-18 DIAGNOSIS — Z791 Long term (current) use of non-steroidal anti-inflammatories (NSAID): Secondary | ICD-10-CM | POA: Insufficient documentation

## 2015-01-18 DIAGNOSIS — M5432 Sciatica, left side: Secondary | ICD-10-CM | POA: Diagnosis not present

## 2015-01-18 DIAGNOSIS — Z720 Tobacco use: Secondary | ICD-10-CM | POA: Insufficient documentation

## 2015-01-18 MED ORDER — IBUPROFEN 800 MG PO TABS
800.0000 mg | ORAL_TABLET | Freq: Once | ORAL | Status: AC
  Administered 2015-01-18: 800 mg via ORAL
  Filled 2015-01-18: qty 1

## 2015-01-18 MED ORDER — OXYCODONE-ACETAMINOPHEN 5-325 MG PO TABS: 1.0000 | ORAL_TABLET | Freq: Four times a day (QID) | ORAL | Status: DC | PRN

## 2015-01-18 NOTE — ED Notes (Signed)
States seen here for back pain on Wed 10/5. States back pain is better but now has pain in left buttock which radiates down left leg. States her left foot is numb

## 2015-01-18 NOTE — ED Provider Notes (Signed)
CSN: [id number]     Arrival date & time 01/18/15  1053 History   First MD Initiated Contact with Patient 01/18/15 1201     Chief Complaint  Patient presents with  . Leg Pain     (Consider location/radiation/quality/duration/timing/severity/associated sxs/prior Treatment) HPI   Patient's a very friendly 44 year old female presenting with back pain. She reports that she's had this back pain since October 5 when she was seen here. She reports originally it was just back pain. Now she feels like it is pain that is radiating down her left leg. She notes that her strength and sensation and function are all normal. Patient has had this happen before.  Patient has great follow-up with her primary care provider. She has had blood drawn for her physical which will occur next week.  Past Medical History  Diagnosis Date  . Hypertension    Past Surgical History  Procedure Laterality Date  . Uterine fibroid surgery    . Myomectomy    . Tubal ligation     No family history on file. Social History  Substance Use Topics  . Smoking status: Current Every Day Smoker -- 0.50 packs/day    Types: Cigarettes  . Smokeless tobacco: Never Used  . Alcohol Use: Yes     Comment: occassionally    OB History    Gravida Para Term Preterm AB TAB SAB Ectopic Multiple Living   3 1 1  0 1 1 0 0 0 1     Review of Systems  Constitutional: Negative for activity change.  Respiratory: Negative for shortness of breath.   Cardiovascular: Negative for chest pain.  Gastrointestinal: Negative for abdominal pain.  Musculoskeletal: Positive for back pain. Negative for gait problem.  Neurological: Negative for weakness, light-headedness and numbness.      Allergies  Review of patient's allergies indicates no known allergies.  Home Medications   Prior to Admission medications   Medication Sig Start Date End Date Taking? Authorizing Provider  Aspirin-Salicylamide-Caffeine (ARTHRITIS STRENGTH BC POWDER PO) Take  by mouth.   Yes Historical Provider, MD  cyclobenzaprine (FLEXERIL) 10 MG tablet Take 1 tablet (10 mg total) by mouth 3 (three) times daily as needed for muscle spasms. 01/08/15  Yes Jeffrey Hedges, PA-C  ibuprofen (ADVIL,MOTRIN) 800 MG tablet Take 1 tablet (800 mg total) by mouth 3 (three) times daily. 01/08/15  Yes Jeffrey Hedges, PA-C  HYDROcodone-acetaminophen (NORCO/VICODIN) 5-325 MG tablet Take 1 tablet by mouth every 6 (six) hours as needed. 01/08/15   Eyvonne MechanicJeffrey Hedges, PA-C  naproxen (NAPROSYN) 500 MG tablet Take 1 tablet (500 mg total) by mouth 2 (two) times daily. 06/04/14   Tatyana Kirichenko, PA-C   BP 171/92 mmHg  Pulse 80  Temp(Src) 97.5 F (36.4 C) (Oral)  Resp 18  Ht 5\' 6"  (1.676 m)  Wt 176 lb (79.833 kg)  BMI 28.42 kg/m2  SpO2 96%  LMP 01/12/2015 Physical Exam  Constitutional: She is oriented to person, place, and time. She appears well-developed and well-nourished.  HENT:  Head: Normocephalic and atraumatic.  Eyes: Conjunctivae are normal. Right eye exhibits no discharge.  Neck: Neck supple.  Cardiovascular: Normal rate, regular rhythm and normal heart sounds.   No murmur heard. Pulmonary/Chest: Effort normal and breath sounds normal. She has no wheezes. She has no rales.  Abdominal: Soft. She exhibits no distension. There is no tenderness.  Musculoskeletal: Normal range of motion. She exhibits no edema.  Patient able to straight leg raise bilaterally no pain.. Full sensation. Full strength of bilateral  lower extremities. Sensation intact.  No pain to palpation of back CT or L-spine.  Neurological: She is oriented to person, place, and time. No cranial nerve deficit.  Skin: Skin is warm and dry. No rash noted. She is not diaphoretic.  Psychiatric: She has a normal mood and affect. Her behavior is normal.  Nursing note and vitals reviewed.   ED Course  Procedures (including critical care time) Labs Review Labs Reviewed - No data to display  Imaging Review No  results found. I have personally reviewed and evaluated these images and lab results as part of my medical decision-making.   EKG Interpretation None      MDM   Final diagnoses:  None    Patient is a 44 year old female presenting with back pain. This pain sounds consistent with sciatic pain. We will give patient ibuprofen and Percocet to help at home. We went over red flags to watch out for including bowel or bladder incontinence, fevers, weakness. She didn't have any fo these signs now. She states she will follow up with her primary care physician this week.    Elveria Lauderbaugh Randall An, MD 01/18/15 1205

## 2015-01-18 NOTE — Discharge Instructions (Signed)
As we discussed need a follow-up with your regular physician. They may want to do imaging or refer you to a specialist.   Back Exercises The following exercises strengthen the muscles that help to support the back. They also help to keep the lower back flexible. Doing these exercises can help to prevent back pain or lessen existing pain. If you have back pain or discomfort, try doing these exercises 2-3 times each day or as told by your health care provider. When the pain goes away, do them once each day, but increase the number of times that you repeat the steps for each exercise (do more repetitions). If you do not have back pain or discomfort, do these exercises once each day or as told by your health care provider. EXERCISES Single Knee to Chest Repeat these steps 3-5 times for each leg: 1. Lie on your back on a firm bed or the floor with your legs extended. 2. Bring one knee to your chest. Your other leg should stay extended and in contact with the floor. 3. Hold your knee in place by grabbing your knee or thigh. 4. Pull on your knee until you feel a gentle stretch in your lower back. 5. Hold the stretch for 10-30 seconds. 6. Slowly release and straighten your leg. Pelvic Tilt Repeat these steps 5-10 times: 1. Lie on your back on a firm bed or the floor with your legs extended. 2. Bend your knees so they are pointing toward the ceiling and your feet are flat on the floor. 3. Tighten your lower abdominal muscles to press your lower back against the floor. This motion will tilt your pelvis so your tailbone points up toward the ceiling instead of pointing to your feet or the floor. 4. With gentle tension and even breathing, hold this position for 5-10 seconds. Cat-Cow Repeat these steps until your lower back becomes more flexible: 1. Get into a hands-and-knees position on a firm surface. Keep your hands under your shoulders, and keep your knees under your hips. You may place padding under  your knees for comfort. 2. Let your head hang down, and point your tailbone toward the floor so your lower back becomes rounded like the back of a cat. 3. Hold this position for 5 seconds. 4. Slowly lift your head and point your tailbone up toward the ceiling so your back forms a sagging arch like the back of a cow. 5. Hold this position for 5 seconds. Press-Ups Repeat these steps 5-10 times: 1. Lie on your abdomen (face-down) on the floor. 2. Place your palms near your head, about shoulder-width apart. 3. While you keep your back as relaxed as possible and keep your hips on the floor, slowly straighten your arms to raise the top half of your body and lift your shoulders. Do not use your back muscles to raise your upper torso. You may adjust the placement of your hands to make yourself more comfortable. 4. Hold this position for 5 seconds while you keep your back relaxed. 5. Slowly return to lying flat on the floor. Bridges Repeat these steps 10 times: 1. Lie on your back on a firm surface. 2. Bend your knees so they are pointing toward the ceiling and your feet are flat on the floor. 3. Tighten your buttocks muscles and lift your buttocks off of the floor until your waist is at almost the same height as your knees. You should feel the muscles working in your buttocks and the back of your  thighs. If you do not feel these muscles, slide your feet 1-2 inches farther away from your buttocks. 4. Hold this position for 3-5 seconds. 5. Slowly lower your hips to the starting position, and allow your buttocks muscles to relax completely. If this exercise is too easy, try doing it with your arms crossed over your chest. Abdominal Crunches Repeat these steps 5-10 times: 1. Lie on your back on a firm bed or the floor with your legs extended. 2. Bend your knees so they are pointing toward the ceiling and your feet are flat on the floor. 3. Cross your arms over your chest. 4. Tip your chin slightly  toward your chest without bending your neck. 5. Tighten your abdominal muscles and slowly raise your trunk (torso) high enough to lift your shoulder blades a tiny bit off of the floor. Avoid raising your torso higher than that, because it can put too much stress on your low back and it does not help to strengthen your abdominal muscles. 6. Slowly return to your starting position. Back Lifts Repeat these steps 5-10 times: 1. Lie on your abdomen (face-down) with your arms at your sides, and rest your forehead on the floor. 2. Tighten the muscles in your legs and your buttocks. 3. Slowly lift your chest off of the floor while you keep your hips pressed to the floor. Keep the back of your head in line with the curve in your back. Your eyes should be looking at the floor. 4. Hold this position for 3-5 seconds. 5. Slowly return to your starting position. SEEK MEDICAL CARE IF:  Your back pain or discomfort gets much worse when you do an exercise.  Your back pain or discomfort does not lessen within 2 hours after you exercise. If you have any of these problems, stop doing these exercises right away. Do not do them again unless your health care provider says that you can. SEEK IMMEDIATE MEDICAL CARE IF:  You develop sudden, severe back pain. If this happens, stop doing the exercises right away. Do not do them again unless your health care provider says that you can.   This information is not intended to replace advice given to you by your health care provider. Make sure you discuss any questions you have with your health care provider.   Document Released: 04/29/2004 Document Revised: 12/11/2014 Document Reviewed: 05/16/2014 Elsevier Interactive Patient Education 2016 [employer]  Back Exercises If you have pain in your back, do these exercises 2-3 times each day or as told by your doctor. When the pain goes away, do the exercises once each day, but repeat the steps more times for each exercise  (do more repetitions). If you do not have pain in your back, do these exercises once each day or as told by your doctor. EXERCISES Single Knee to Chest Do these steps 3-5 times in a row for each leg: 7. Lie on your back on a firm bed or the floor with your legs stretched out. 8. Bring one knee to your chest. 9. Hold your knee to your chest by grabbing your knee or thigh. 10. Pull on your knee until you feel a gentle stretch in your lower back. 11. Keep doing the stretch for 10-30 seconds. 12. Slowly let go of your leg and straighten it. Pelvic Tilt Do these steps 5-10 times in a row: 5. Lie on your back on a firm bed or the floor with your legs stretched out. 6. Bend your knees so  they point up to the ceiling. Your feet should be flat on the floor. 7. Tighten your lower belly (abdomen) muscles to press your lower back against the floor. This will make your tailbone point up to the ceiling instead of pointing down to your feet or the floor. 8. Stay in this position for 5-10 seconds while you gently tighten your muscles and breathe evenly. Cat-Cow Do these steps until your lower back bends more easily: 6. Get on your hands and knees on a firm surface. Keep your hands under your shoulders, and keep your knees under your hips. You may put padding under your knees. 7. Let your head hang down, and make your tailbone point down to the floor so your lower back is round like the back of a cat. 8. Stay in this position for 5 seconds. 9. Slowly lift your head and make your tailbone point up to the ceiling so your back hangs low (sags) like the back of a cow. 10. Stay in this position for 5 seconds. Press-Ups Do these steps 5-10 times in a row: 6. Lie on your belly (face-down) on the floor. 7. Place your hands near your head, about shoulder-width apart. 8. While you keep your back relaxed and keep your hips on the floor, slowly straighten your arms to raise the top half of your body and lift your  shoulders. Do not use your back muscles. To make yourself more comfortable, you may change where you place your hands. 9. Stay in this position for 5 seconds. 10. Slowly return to lying flat on the floor. Bridges Do these steps 10 times in a row: 6. Lie on your back on a firm surface. 7. Bend your knees so they point up to the ceiling. Your feet should be flat on the floor. 8. Tighten your butt muscles and lift your butt off of the floor until your waist is almost as high as your knees. If you do not feel the muscles working in your butt and the back of your thighs, slide your feet 1-2 inches farther away from your butt. 9. Stay in this position for 3-5 seconds. 10. Slowly lower your butt to the floor, and let your butt muscles relax. If this exercise is too easy, try doing it with your arms crossed over your chest. Belly Crunches Do these steps 5-10 times in a row: 7. Lie on your back on a firm bed or the floor with your legs stretched out. 8. Bend your knees so they point up to the ceiling. Your feet should be flat on the floor. 9. Cross your arms over your chest. 10. Tip your chin a little bit toward your chest but do not bend your neck. 11. Tighten your belly muscles and slowly raise your chest just enough to lift your shoulder blades a tiny bit off of the floor. 12. Slowly lower your chest and your head to the floor. Back Lifts Do these steps 5-10 times in a row: 6. Lie on your belly (face-down) with your arms at your sides, and rest your forehead on the floor. 7. Tighten the muscles in your legs and your butt. 8. Slowly lift your chest off of the floor while you keep your hips on the floor. Keep the back of your head in line with the curve in your back. Look at the floor while you do this. 9. Stay in this position for 3-5 seconds. 10. Slowly lower your chest and your face to the floor. GET HELP  IF:  Your back pain gets a lot worse when you do an exercise.  Your back pain does not  lessen 2 hours after you exercise. If you have any of these problems, stop doing the exercises. Do not do them again unless your doctor says it is okay. GET HELP RIGHT AWAY IF:  You have sudden, very bad back pain. If this happens, stop doing the exercises. Do not do them again unless your doctor says it is okay.   This information is not intended to replace advice given to you by your health care provider. Make sure you discuss any questions you have with your health care provider.   Document Released: 04/24/2010 Document Revised: 12/11/2014 Document Reviewed: 05/16/2014 Elsevier Interactive Patient Education [employer] Inc[employer]

## 2015-01-18 NOTE — ED Notes (Signed)
Rx x 1 given for percocet- D/c resource guide given- f/u care discussed

## 2016-01-04 ENCOUNTER — Emergency Department (HOSPITAL_BASED_OUTPATIENT_CLINIC_OR_DEPARTMENT_OTHER)
Admission: EM | Admit: 2016-01-04 | Discharge: 2016-01-04 | Disposition: A | Discharge status: Home / Self Care | Payer: Medicaid Other | Attending: Emergency Medicine | Admitting: Emergency Medicine

## 2016-01-04 ENCOUNTER — Encounter (HOSPITAL_BASED_OUTPATIENT_CLINIC_OR_DEPARTMENT_OTHER): Payer: Self-pay | Admitting: Emergency Medicine

## 2016-01-04 DIAGNOSIS — Y939 Activity, unspecified: Secondary | ICD-10-CM | POA: Insufficient documentation

## 2016-01-04 DIAGNOSIS — X58XXXA Exposure to other specified factors, initial encounter: Secondary | ICD-10-CM | POA: Insufficient documentation

## 2016-01-04 DIAGNOSIS — S39012A Strain of muscle, fascia and tendon of lower back, initial encounter: Secondary | ICD-10-CM | POA: Insufficient documentation

## 2016-01-04 DIAGNOSIS — Y999 Unspecified external cause status: Secondary | ICD-10-CM | POA: Insufficient documentation

## 2016-01-04 DIAGNOSIS — F17210 Nicotine dependence, cigarettes, uncomplicated: Secondary | ICD-10-CM | POA: Insufficient documentation

## 2016-01-04 DIAGNOSIS — Y929 Unspecified place or not applicable: Secondary | ICD-10-CM | POA: Insufficient documentation

## 2016-01-04 DIAGNOSIS — Z7982 Long term (current) use of aspirin: Secondary | ICD-10-CM | POA: Insufficient documentation

## 2016-01-04 DIAGNOSIS — I10 Essential (primary) hypertension: Secondary | ICD-10-CM | POA: Insufficient documentation

## 2016-01-04 MED ORDER — TRAMADOL HCL 50 MG PO TABS: 50.0000 mg | ORAL_TABLET | Freq: Four times a day (QID) | ORAL | 0 refills | Status: DC | PRN

## 2016-01-04 MED ORDER — CYCLOBENZAPRINE HCL 10 MG PO TABS: 10.0000 mg | ORAL_TABLET | Freq: Three times a day (TID) | ORAL | 0 refills | Status: DC | PRN

## 2016-01-04 MED ORDER — DEXAMETHASONE SODIUM PHOSPHATE 10 MG/ML IJ SOLN
10.0000 mg | Freq: Once | INTRAMUSCULAR | Status: AC
  Administered 2016-01-04: 10 mg via INTRAMUSCULAR
  Filled 2016-01-04: qty 1

## 2016-01-04 MED ORDER — IBUPROFEN 800 MG PO TABS: 800.0000 mg | ORAL_TABLET | Freq: Three times a day (TID) | ORAL | 0 refills | Status: DC

## 2016-01-04 NOTE — ED Triage Notes (Signed)
Patient c/o pain to left lower back, started yesterday. 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 Patient has not attempted any interventions at home PTA.

## 2016-01-04 NOTE — ED Provider Notes (Signed)
[facility] Provider Note   CSN: [id number] Arrival date & time: 01/04/16  1510  By signing my name below, I, Arianna Nassar, attest that this documentation has been prepared under the direction and in the presence of Rolan Bucco, MD.  Electronically Signed: Octavia Heir, ED Scribe. 01/04/16. 4:53 PM.    History   Chief Complaint Chief Complaint  Patient presents with  . Back Pain    lower, left     The history is provided by the patient. No language interpreter was used.   HPI Comments: Janice Bailey is a 45 y.o. female who presents to the Emergency Department complaining of non-radiating mid to left lower back pain x 1 day. Pt states that she was helping her sister move a dresser yesterday when she felt a sudden "pull" in her back. Pt notes she has a hx of pulled muscles in the past which radiated down her leg. She has not taken any pain medication to alleviate her symptoms. Pt denies numbness, bowel or bladder incontinence, abdominal pain, or hx of bulging disc.  Past Medical History:  Diagnosis Date  . Hypertension     There are no active problems to display for this patient.   Past Surgical History:  Procedure Laterality Date  . CESAREAN SECTION  2014  . MYOMECTOMY    . TUBAL LIGATION    . UTERINE FIBROID SURGERY      OB History    Gravida Para Term Preterm AB Living   3 1 1  0 1 1   SAB TAB Ectopic Multiple Live Births   0 1 0 0         Home Medications    Prior to Admission medications   Medication Sig Start Date End Date Taking? Authorizing Provider  Aspirin-Salicylamide-Caffeine (ARTHRITIS STRENGTH BC POWDER PO) Take by mouth.    Historical Provider, MD  cyclobenzaprine (FLEXERIL) 10 MG tablet Take 1 tablet (10 mg total) by mouth 3 (three) times daily as needed for muscle spasms. 01/04/16   Rolan Bucco, MD  HYDROcodone-acetaminophen (NORCO/VICODIN) 5-325 MG tablet Take 1 tablet by mouth every 6 (six) hours as needed. 01/08/15   Eyvonne Mechanic, PA-C  ibuprofen (ADVIL,MOTRIN) 800 MG tablet Take 1 tablet (800 mg total) by mouth 3 (three) times daily. 01/04/16   Rolan Bucco, MD  naproxen (NAPROSYN) 500 MG tablet Take 1 tablet (500 mg total) by mouth 2 (two) times daily. 06/04/14   Tatyana Kirichenko, PA-C  oxyCODONE-acetaminophen (PERCOCET/ROXICET) 5-325 MG tablet Take 1 tablet by mouth every 6 (six) hours as needed for severe pain. 01/18/15   Courteney Lyn Mackuen, MD  traMADol (ULTRAM) 50 MG tablet Take 1 tablet (50 mg total) by mouth every 6 (six) hours as needed. 01/04/16   Rolan Bucco, MD    Family History History reviewed. No pertinent family history.  Social History Social History  Substance Use Topics  . Smoking status: Current Every Day Smoker    Packs/day: 0.50    Types: Cigarettes  . Smokeless tobacco: Never Used  . Alcohol use Yes     Comment: occassionally      Allergies   Review of patient's allergies indicates no known allergies.   Review of Systems Review of Systems  Gastrointestinal: Negative for abdominal pain.  Neurological: Negative for numbness.     Physical Exam Updated Vital Signs BP 197/95 (BP Location: Right Arm)   Pulse 63   Temp 98.4 F (36.9 C) (Oral)   Resp 16   Ht 5'  4" (1.626 m)   Wt 175 lb (79.4 kg)   LMP 12/30/2015 (Exact Date)   SpO2 99%   BMI 30.04 kg/m   Physical Exam  Constitutional: She is oriented to person, place, and time. She appears well-developed and well-nourished.  HENT:  Head: Normocephalic and atraumatic.  Eyes: Pupils are equal, round, and reactive to light.  Neck: Normal range of motion. Neck supple.  Cardiovascular: Normal rate, regular rhythm and normal heart sounds.   Pulmonary/Chest: Effort normal and breath sounds normal. No respiratory distress. She has no wheezes. She has no rales. She exhibits no tenderness.  Abdominal: Soft. Bowel sounds are normal. There is no tenderness. There is no rebound and no guarding.  Musculoskeletal: Normal  range of motion. She exhibits tenderness. She exhibits no edema.  TTP to left lower lumbar paraspinal and left lumbar musculature No pain to sciatic nerve Negative SLR Normal sensation and motor function of LE Pedal pulses intact  Lymphadenopathy:    She has no cervical adenopathy.  Neurological: She is alert and oriented to person, place, and time.  Skin: Skin is warm and dry. No rash noted.  Psychiatric: She has a normal mood and affect.     ED Treatments / Results  DIAGNOSTIC STUDIES: Oxygen Saturation is 99% on RA, normal by my interpretation.  COORDINATION OF CARE:  4:22 PM Discussed treatment plan with pt at bedside and pt agreed to plan.  Labs (all labs ordered are listed, but only abnormal results are displayed) Labs Reviewed - No data to display  EKG  EKG Interpretation None       Radiology No results found.  Procedures Procedures (including critical care time)  Medications Ordered in ED Medications  dexamethasone (DECADRON) injection 10 mg (not administered)     Initial Impression / Assessment and Plan / ED Course  I have reviewed the triage vital signs and the nursing notes.  Pertinent labs & imaging results that were available during my care of the patient were reviewed by me and considered in my medical decision making (see chart for details).  Clinical Course    Patient presents with lower back pain. She has no radicular symptoms. No neurologic deficits. No signs of cauda equina. I don't feel that imaging studies are indicated. She is driving and couldn't get medication for pain in the ED. However she was given a shot of Decadron. She was discharged home in good condition. She was given prescriptions for ibuprofen, Flexeril and tramadol. She was encouraged to follow-up with her PCP if her symptoms are not improving.  I personally performed the services described in this documentation, which was scribed in my presence.  The recorded information has  been reviewed and considered.  Final Clinical Impressions(s) / ED Diagnoses   Final diagnoses:  Strain of lumbar region, initial encounter    New Prescriptions New Prescriptions   TRAMADOL (ULTRAM) 50 MG TABLET    Take 1 tablet (50 mg total) by mouth every 6 (six) hours as needed.     Rolan BuccoMelanie Sherron Mapp, MD 01/04/16 [phone]

## 2017-07-29 ENCOUNTER — Encounter (HOSPITAL_BASED_OUTPATIENT_CLINIC_OR_DEPARTMENT_OTHER): Payer: Self-pay | Admitting: Emergency Medicine

## 2017-07-29 ENCOUNTER — Other Ambulatory Visit: Payer: Self-pay

## 2017-07-29 ENCOUNTER — Emergency Department (HOSPITAL_BASED_OUTPATIENT_CLINIC_OR_DEPARTMENT_OTHER)
Admission: EM | Admit: 2017-07-29 | Discharge: 2017-07-29 | Disposition: A | Discharge status: Home / Self Care | Payer: Self-pay | Attending: Emergency Medicine | Admitting: Emergency Medicine

## 2017-07-29 DIAGNOSIS — Z7982 Long term (current) use of aspirin: Secondary | ICD-10-CM | POA: Insufficient documentation

## 2017-07-29 DIAGNOSIS — I10 Essential (primary) hypertension: Secondary | ICD-10-CM | POA: Insufficient documentation

## 2017-07-29 DIAGNOSIS — L03211 Cellulitis of face: Secondary | ICD-10-CM | POA: Insufficient documentation

## 2017-07-29 DIAGNOSIS — Z79899 Other long term (current) drug therapy: Secondary | ICD-10-CM | POA: Insufficient documentation

## 2017-07-29 DIAGNOSIS — H9191 Unspecified hearing loss, right ear: Secondary | ICD-10-CM | POA: Insufficient documentation

## 2017-07-29 DIAGNOSIS — F17210 Nicotine dependence, cigarettes, uncomplicated: Secondary | ICD-10-CM | POA: Insufficient documentation

## 2017-07-29 MED ORDER — CEPHALEXIN 500 MG PO CAPS: 500.0000 mg | ORAL_CAPSULE | Freq: Four times a day (QID) | ORAL | 0 refills | Status: DC

## 2017-07-29 MED FILL — CEPHALEXIN 500 MG CAPSULE: 500 | 7 days supply | Qty: 28 | Fill #0

## 2017-07-29 NOTE — ED Notes (Signed)
ED Provider at bedside. 

## 2017-07-29 NOTE — Discharge Instructions (Addendum)
You were seen in the emergency department for right ear swelling pain and redness.  This is likely inflammation or early infection.  He should use a cool compress and take ibuprofen or naproxen as an anti-inflammatory.  We are also prescribing you an antibiotic to take for a week.  Please return to the emergency department if any worsening symptoms.

## 2017-07-29 NOTE — ED Provider Notes (Signed)
[facility] EMERGENCY DEPARTMENT Provider Note   CSN: [id number] Arrival date & time: 07/29/17  4098     History   Chief Complaint Chief Complaint  Patient presents with  . Insect Bite    HPI Janice Bailey is a 47 y.o. female.  She is presenting today with waking up at 4 AM with pain around her right ear.  She states it is throbbing and uncomfortable and increased with palpation.  She thinks it might of been a bite although did not actually see a spider or insect there.  She is never had this before.  She states her hearing feels a little muffled in that area.  There is no other rash or bite that she is noted.  She is tried nothing for it.  The history is provided by the patient.  Animal Bite  Attacking animal: ? insect/spider. Location:  Head/neck Head/neck injury location:  R ear Time since incident:  6 hours Pain details:    Quality:  Aching and burning   Severity:  Moderate   Timing:  Constant   Progression:  Unchanged Incident location:  Home Relieved by:  Nothing Exacerbated by: palpitation. Ineffective treatments:  None tried Associated symptoms: swelling   Associated symptoms: no fever, no numbness and no rash     Past Medical History:  Diagnosis Date  . Hypertension     There are no active problems to display for this patient.   Past Surgical History:  Procedure Laterality Date  . CESAREAN SECTION  2014  . MYOMECTOMY    . TUBAL LIGATION    . UTERINE FIBROID SURGERY       OB History    Gravida  3   Para  1   Term  1   Preterm  0   AB  1   Living  1     SAB  0   TAB  1   Ectopic  0   Multiple  0   Live Births               Home Medications    Prior to Admission medications   Medication Sig Start Date End Date Taking? Authorizing Provider  Aspirin-Salicylamide-Caffeine (ARTHRITIS STRENGTH BC POWDER PO) Take by mouth.    [provider]  cyclobenzaprine (FLEXERIL) 10 MG tablet Take 1 tablet (10 mg total)  by mouth 3 (three) times daily as needed for muscle spasms. 01/04/16   Rolan Bucco, MD  HYDROcodone-acetaminophen (NORCO/VICODIN) 5-325 MG tablet Take 1 tablet by mouth every 6 (six) hours as needed. 01/08/15   Hedges, Tinnie Gens, PA-C  ibuprofen (ADVIL,MOTRIN) 800 MG tablet Take 1 tablet (800 mg total) by mouth 3 (three) times daily. 01/04/16   Rolan Bucco, MD  naproxen (NAPROSYN) 500 MG tablet Take 1 tablet (500 mg total) by mouth 2 (two) times daily. 06/04/14   Kirichenko, Lemont Fillers, PA-C  oxyCODONE-acetaminophen (PERCOCET/ROXICET) 5-325 MG tablet Take 1 tablet by mouth every 6 (six) hours as needed for severe pain. 01/18/15   Mackuen, Courteney Lyn, MD  traMADol (ULTRAM) 50 MG tablet Take 1 tablet (50 mg total) by mouth every 6 (six) hours as needed. 01/04/16   Rolan Bucco, MD    Family History No family history on file.  Social History Social History   Tobacco Use  . Smoking status: Current Every Day Smoker    Packs/day: 0.50    Types: Cigarettes  . Smokeless tobacco: Never Used  Substance Use Topics  . Alcohol use: Yes  Comment: occassionally   . Drug use: No     Allergies   Patient has no known allergies.   Review of Systems Review of Systems  Constitutional: Negative for fever.  HENT: Positive for ear pain and hearing loss. Negative for sore throat and tinnitus.   Respiratory: Negative for shortness of breath.   Cardiovascular: Negative for chest pain.  Gastrointestinal: Negative for abdominal pain.  Genitourinary: Negative for dysuria.  Skin: Negative for rash.  Neurological: Negative for numbness.     Physical Exam Updated Vital Signs BP (!) 169/96 (BP Location: Left Arm)   Pulse (!) 55   Temp 98.6 F (37 C) (Oral)   Resp 16   Ht 5\' 4"  (1.626 m)   Wt 77.1 kg (170 lb)   LMP 07/16/2017   SpO2 98%   BMI 29.18 kg/m   Physical Exam  Constitutional: She appears well-developed and well-nourished.  HENT:  Head: Normocephalic and atraumatic.  Right Ear:  Tympanic membrane and ear canal normal. There is swelling and tenderness.  Left Ear: External ear normal.  Ears:  Nose: Nose normal.  Mouth/Throat: Oropharynx is clear and moist. No oropharyngeal exudate.  Eyes: Conjunctivae and EOM are normal. Right eye exhibits no discharge. Left eye exhibits no discharge.  Neck: Neck supple.  Lymphadenopathy:    She has no cervical adenopathy.  Neurological: She is alert. GCS eye subscore is 4. GCS verbal subscore is 5. GCS motor subscore is 6.  Skin: Skin is warm and dry.  Psychiatric: She has a normal mood and affect.     ED Treatments / Results  Labs (all labs ordered are listed, but only abnormal results are displayed) Labs Reviewed - No data to display  EKG None  Radiology No results found.  Procedures Procedures (including critical care time)  Medications Ordered in ED Medications - No data to display   Initial Impression / Assessment and Plan / ED Course  I have reviewed the triage vital signs and the nursing notes.  Pertinent labs & imaging results that were available during my care of the patient were reviewed by me and considered in my medical decision making (see chart for details).      Final Clinical Impressions(s) / ED Diagnoses   Final diagnoses:  Cellulitis of face    ED Discharge Orders        Ordered    cephALEXin (KEFLEX) 500 MG capsule  4 times daily     07/29/17 0939       Terrilee FilesButler, Michael C, MD 07/30/17 1724

## 2017-07-29 NOTE — ED Triage Notes (Signed)
Pt reports something bit her R ear during the night. Area is swollen and burning.

## 2018-04-21 ENCOUNTER — Emergency Department (HOSPITAL_BASED_OUTPATIENT_CLINIC_OR_DEPARTMENT_OTHER): Payer: Self-pay

## 2018-04-21 ENCOUNTER — Emergency Department (HOSPITAL_BASED_OUTPATIENT_CLINIC_OR_DEPARTMENT_OTHER)
Admission: EM | Admit: 2018-04-21 | Discharge: 2018-04-21 | Disposition: A | Discharge status: Home / Self Care | Payer: Self-pay | Attending: Emergency Medicine | Admitting: Emergency Medicine

## 2018-04-21 ENCOUNTER — Encounter (HOSPITAL_BASED_OUTPATIENT_CLINIC_OR_DEPARTMENT_OTHER): Payer: Self-pay | Admitting: *Deleted

## 2018-04-21 ENCOUNTER — Other Ambulatory Visit: Payer: Self-pay

## 2018-04-21 DIAGNOSIS — F17210 Nicotine dependence, cigarettes, uncomplicated: Secondary | ICD-10-CM | POA: Insufficient documentation

## 2018-04-21 DIAGNOSIS — I10 Essential (primary) hypertension: Secondary | ICD-10-CM | POA: Insufficient documentation

## 2018-04-21 DIAGNOSIS — R42 Dizziness and giddiness: Secondary | ICD-10-CM | POA: Insufficient documentation

## 2018-04-21 DIAGNOSIS — Z7982 Long term (current) use of aspirin: Secondary | ICD-10-CM | POA: Insufficient documentation

## 2018-04-21 LAB — BASIC METABOLIC PANEL
Anion gap: 6 (ref 5–15)
BUN: 14 mg/dL (ref 6–20)
CO2: 25 mmol/L (ref 22–32)
Calcium: 8.9 mg/dL (ref 8.9–10.3)
Chloride: 107 mmol/L (ref 98–111)
Creatinine, Ser: 0.78 mg/dL (ref 0.44–1.00)
GFR calc Af Amer: >60 mL/min (ref >60)
GFR calc non Af Amer: >60 mL/min (ref >60)
Glucose, Bld: 87 mg/dL (ref 70–99)
Potassium: 3.7 mmol/L (ref 3.5–5.1)
Sodium: 138 mmol/L (ref 135–145)

## 2018-04-21 LAB — CBC WITH DIFFERENTIAL/PLATELET
Abs Immature Granulocytes: 0.04 10*3/uL (ref 0.00–0.07)
Basophils Absolute: 0 10*3/uL (ref 0.0–0.1)
Basophils Relative: 1 %
EOS PCT: 2 %
Eosinophils Absolute: 0.1 10*3/uL (ref 0.0–0.5)
HCT: 45.1 % (ref 36.0–46.0)
HEMOGLOBIN: 13.9 g/dL (ref 12.0–15.0)
Immature Granulocytes: 1 %
LYMPHS PCT: 30 %
Lymphs Abs: 1.2 10*3/uL (ref 0.7–4.0)
MCH: 29.5 pg (ref 26.0–34.0)
MCHC: 30.8 g/dL (ref 30.0–36.0)
MCV: 95.8 fL (ref 80.0–100.0)
Monocytes Absolute: 0.3 10*3/uL (ref 0.1–1.0)
Monocytes Relative: 7 %
Neutro Abs: 2.4 10*3/uL (ref 1.7–7.7)
Neutrophils Relative %: 59 %
Platelets: 329 10*3/uL (ref 150–400)
RBC: 4.71 MIL/uL (ref 3.87–5.11)
RDW: 13.2 % (ref 11.5–15.5)
WBC: 4 10*3/uL (ref 4.0–10.5)
nRBC: 0 % (ref 0.0–0.2)

## 2018-04-21 LAB — URINALYSIS, ROUTINE W REFLEX MICROSCOPIC
Bilirubin Urine: NEGATIVE
Glucose, UA: NEGATIVE mg/dL
Ketones, ur: NEGATIVE mg/dL
Leukocytes, UA: NEGATIVE
Nitrite: NEGATIVE
Protein, ur: NEGATIVE mg/dL
SPECIFIC GRAVITY, URINE: 1.02 (ref 1.005–1.030)
pH: 7.5 (ref 5.0–8.0)

## 2018-04-21 LAB — URINALYSIS, MICROSCOPIC (REFLEX)

## 2018-04-21 LAB — TROPONIN I: Troponin I: <0.03 ng/mL (ref <0.03)

## 2018-04-21 MED ORDER — HYDROCHLOROTHIAZIDE 25 MG PO TABS: 25.0000 mg | ORAL_TABLET | Freq: Every day | ORAL | 2 refills | Status: AC

## 2018-04-21 MED ORDER — CLONIDINE HCL 0.1 MG PO TABS
0.1000 mg | ORAL_TABLET | Freq: Once | ORAL | Status: AC
  Administered 2018-04-21: 0.1 mg via ORAL
  Filled 2018-04-21: qty 1

## 2018-04-21 MED ORDER — SODIUM CHLORIDE 0.9 % IV BOLUS
1000.0000 mL | Freq: Once | INTRAVENOUS | Status: AC
  Administered 2018-04-21: 1000 mL via INTRAVENOUS

## 2018-04-21 NOTE — Discharge Instructions (Addendum)
You were seen in the emergency department for feeling lightheaded and unsteady.  Your blood pressure was quite high here.  Your lab work and CAT scan did not show an obvious reason for your symptoms.  They seemed improved after some fluids here.  We started you on a little low-dose blood pressure medicine but you will need to get a primary care doctor as this will need to be managed more closely.  Please return if any worsening symptoms.

## 2018-04-21 NOTE — ED Triage Notes (Signed)
Pt got up at 5:22am and staggered in her walk, was dizzy and had blurred vision.

## 2018-04-21 NOTE — ED Notes (Signed)
Pt remains on auto VS 

## 2018-04-21 NOTE — ED Provider Notes (Signed)
[facility] EMERGENCY DEPARTMENT Provider Note   CSN: [id number] Arrival date & time: 04/21/18  0865     History   Chief Complaint Chief Complaint  Patient presents with  . Dizziness    HPI Janice Bailey is a 48 y.o. female.  She presents the emergency department after feeling acutely unwell this morning when she woke up.  She said she felt weak all over had blurred vision felt dizzy/lightheaded and felt unsteady walking.  She denies having had this before.  No recent illness.  She is not on any medications currently.  She does not have a primary care doctor and has been told before that her blood pressure is elevated.  Last menstrual period was few days ago and is finishing.  No numbness no focal weakness no headache no blurry vision currently.  No chest pain no shortness of breath.  No abdominal pain vomiting or diarrhea.  No urinary symptoms.  She says her right shoulder is been bothering her recently but she thinks is related to work that she does.  The history is provided by the patient.  Dizziness  Quality:  Lightheadedness Severity:  Severe Onset quality:  Sudden Timing:  Constant Progression:  Unchanged Chronicity:  New Context: physical activity and standing up   Context: not with ear pain, not with eye movement and not with loss of consciousness   Relieved by:  Being still Worsened by:  Nothing Ineffective treatments:  None tried Associated symptoms: vision changes   Associated symptoms: no blood in stool, no chest pain, no diarrhea, no headaches, no hearing loss, no nausea, no palpitations, no shortness of breath, no syncope, no tinnitus, no vomiting and no weakness     Past Medical History:  Diagnosis Date  . Hypertension     There are no active problems to display for this patient.   Past Surgical History:  Procedure Laterality Date  . CESAREAN SECTION  2014  . MYOMECTOMY    . TUBAL LIGATION    . UTERINE FIBROID SURGERY       OB History    Gravida  3   Para  1   Term  1   Preterm  0   AB  1   Living  1     SAB  0   TAB  1   Ectopic  0   Multiple  0   Live Births               Home Medications    Prior to Admission medications   Medication Sig Start Date End Date Taking? Authorizing Provider  Aspirin-Salicylamide-Caffeine (ARTHRITIS STRENGTH BC POWDER PO) Take by mouth.    [provider]  cephALEXin (KEFLEX) 500 MG capsule Take 1 capsule (500 mg total) by mouth 4 (four) times daily. 07/29/17   Terrilee Files, MD  cyclobenzaprine (FLEXERIL) 10 MG tablet Take 1 tablet (10 mg total) by mouth 3 (three) times daily as needed for muscle spasms. 01/04/16   Rolan Bucco, MD  HYDROcodone-acetaminophen (NORCO/VICODIN) 5-325 MG tablet Take 1 tablet by mouth every 6 (six) hours as needed. 01/08/15   Hedges, Tinnie Gens, PA-C  ibuprofen (ADVIL,MOTRIN) 800 MG tablet Take 1 tablet (800 mg total) by mouth 3 (three) times daily. 01/04/16   Rolan Bucco, MD  naproxen (NAPROSYN) 500 MG tablet Take 1 tablet (500 mg total) by mouth 2 (two) times daily. 06/04/14   Kirichenko, Lemont Fillers, PA-C  oxyCODONE-acetaminophen (PERCOCET/ROXICET) 5-325 MG tablet Take 1 tablet by mouth every  6 (six) hours as needed for severe pain. 01/18/15   Mackuen, Courteney Lyn, MD  traMADol (ULTRAM) 50 MG tablet Take 1 tablet (50 mg total) by mouth every 6 (six) hours as needed. 01/04/16   Rolan BuccoBelfi, Melanie, MD    Family History History reviewed. No pertinent family history.  Social History Social History   Tobacco Use  . Smoking status: Current Every Day Smoker    Packs/day: 0.50    Types: Cigarettes  . Smokeless tobacco: Never Used  Substance Use Topics  . Alcohol use: Yes    Comment: occassionally   . Drug use: No     Allergies   Patient has no known allergies.   Review of Systems Review of Systems  Constitutional: Negative for fever.  HENT: Negative for hearing loss, sore throat and tinnitus.   Eyes: Positive for visual  disturbance.  Respiratory: Negative for shortness of breath.   Cardiovascular: Negative for chest pain, palpitations and syncope.  Gastrointestinal: Negative for abdominal pain, blood in stool, diarrhea, nausea and vomiting.  Genitourinary: Negative for dysuria.  Musculoskeletal: Negative for back pain.  Skin: Negative for rash.  Neurological: Positive for dizziness and light-headedness. Negative for syncope, speech difficulty, weakness, numbness and headaches.     Physical Exam Updated Vital Signs BP (!) 204/126 (BP Location: Right Arm)   Pulse (!) 50   Temp 97.6 F (36.4 C) (Oral)   Resp 18   Ht 5\' 6"  (1.676 m)   Wt 77.6 kg   LMP 04/15/2018 (Exact Date)   SpO2 100%   BMI 27.60 kg/m   Physical Exam Vitals signs and nursing note reviewed.  Constitutional:      General: She is not in acute distress.    Appearance: She is well-developed.  HENT:     Head: Normocephalic and atraumatic.     Right Ear: Tympanic membrane normal.     Left Ear: Tympanic membrane normal.  Eyes:     Extraocular Movements: Extraocular movements intact.     Right eye: No nystagmus.     Left eye: No nystagmus.     Conjunctiva/sclera: Conjunctivae normal.     Pupils: Pupils are equal, round, and reactive to light.  Neck:     Musculoskeletal: Neck supple.  Cardiovascular:     Rate and Rhythm: Normal rate and regular rhythm.     Heart sounds: No murmur.  Pulmonary:     Effort: Pulmonary effort is normal. No respiratory distress.     Breath sounds: Normal breath sounds.  Abdominal:     Palpations: Abdomen is soft.     Tenderness: There is no abdominal tenderness. There is no guarding.  Musculoskeletal: Normal range of motion.        General: No tenderness or signs of injury.     Right lower leg: No edema.     Left lower leg: No edema.  Skin:    General: Skin is warm and dry.     Capillary Refill: Capillary refill takes less than 2 seconds.  Neurological:     General: No focal deficit present.      Mental Status: She is alert and oriented to person, place, and time.     Cranial Nerves: No cranial nerve deficit.     Sensory: No sensory deficit.     Motor: No weakness.     Coordination: Coordination normal.     Gait: Gait normal.      ED Treatments / Results  Labs (all labs ordered are listed, but  only abnormal results are displayed) Labs Reviewed  URINALYSIS, ROUTINE W REFLEX MICROSCOPIC - Abnormal; Notable for the following components:      Result Value   APPearance CLOUDY (*)    Hgb urine dipstick SMALL (*)    All other components within normal limits  URINALYSIS, MICROSCOPIC (REFLEX) - Abnormal; Notable for the following components:   Bacteria, UA FEW (*)    All other components within normal limits  TROPONIN I  BASIC METABOLIC PANEL  CBC WITH DIFFERENTIAL/PLATELET    EKG EKG Interpretation  Date/Time:  Friday April 21 2018 10:39:08 EST Ventricular Rate:  51 PR Interval:    QRS Duration: 90 QT Interval:  512 QTC Calculation: 472 R Axis:   62 Text Interpretation:  Sinus rhythm Borderline T abnormalities, inferior leads Baseline wander in lead(s) I II III aVR aVL aVF V2 V6 no prior to compare with Confirmed by Meridee Score [phone]) on 04/21/2018 10:41:22 AM Also confirmed by Meridee Score [phone]), editor Barbette Hair [phone])  on 04/21/2018 11:22:59 AM   Radiology Ct Head Wo Contrast  Result Date: 04/21/2018 CLINICAL DATA:  Acute onset of dizziness this am, hx HTN without medication per pt, nausea without vomiting, blurred vision at times, weakness EXAM: CT HEAD WITHOUT CONTRAST TECHNIQUE: Contiguous axial images were obtained from the base of the skull through the vertex without intravenous contrast. COMPARISON:  None. FINDINGS: Brain: No evidence of acute infarction, hemorrhage, hydrocephalus, extra-axial collection or mass lesion/mass effect. Vascular: No hyperdense vessel or unexpected calcification. Skull: Normal. Negative for fracture or focal lesion.  Sinuses/Orbits: Normal globes and orbits. Sinuses and mastoid air cells are clear. Other: None. IMPRESSION: Normal unenhanced CT scan of the brain. Electronically Signed   By: Amie Portland M.D.   On: 04/21/2018 10:40    Procedures Procedures (including critical care time)  Medications Ordered in ED Medications  sodium chloride 0.9 % bolus 1,000 mL (0 mLs Intravenous Stopped 04/21/18 1225)  cloNIDine (CATAPRES) tablet 0.1 mg (0.1 mg Oral Given 04/21/18 1055)  sodium chloride 0.9 % bolus 1,000 mL (0 mLs Intravenous Stopped 04/21/18 1244)     Initial Impression / Assessment and Plan / ED Course  I have reviewed the triage vital signs and the nursing notes.  Pertinent labs & imaging results that were available during my care of the patient were reviewed by me and considered in my medical decision making (see chart for details).  Clinical Course as of Apr 21 1718  Fri Apr 21, 2018  [id number] 48 year old female with history of hypertension untreated here with acute onset of dizziness lightheadedness blurred vision unsteady gait when she woke up this morning.  She has a nonfocal neuro exam.  No nystagmus.  Blood pressure markedly elevated.  Will check some screening labs urinalysis EKG start some IV fluids.  If BP does not improve may need to be medicated for this.   [MB]  1057 Patient states she does not have any symptoms but she has not stood yet to find out if they are still there.  Blood pressures remain quite elevated.  No evidence of endorgan damage on lab work and head CT.  I have given a oral dose of clonidine and will reassess and ambulate once that is kicked in.   [MB]  1133 Patient states her symptoms are a little bit improved.  She thinks the fluid helped a little bit and her blood pressure is maybe slightly better.   [MB]  1206 Reviewed patient's prior blood pressures while she was  here at other visits.  Generally she has had systolics between 160 and 180 and diastolics between 88-100.    [MB]    Clinical Course User Index [MB] Terrilee Files, MD     Final Clinical Impressions(s) / ED Diagnoses   Final diagnoses:  Dizziness  Hypertension, unspecified type    ED Discharge Orders         Ordered    hydrochlorothiazide (HYDRODIURIL) 25 MG tablet  Daily     04/21/18 1313           Terrilee Files, MD 04/21/18 1720

## 2019-10-22 ENCOUNTER — Emergency Department (HOSPITAL_BASED_OUTPATIENT_CLINIC_OR_DEPARTMENT_OTHER)
Admission: EM | Admit: 2019-10-22 | Discharge: 2019-10-22 | Disposition: A | Discharge status: Home / Self Care | Payer: Medicaid Other | Attending: Emergency Medicine | Admitting: Emergency Medicine

## 2019-10-22 ENCOUNTER — Other Ambulatory Visit: Payer: Self-pay

## 2019-10-22 ENCOUNTER — Encounter (HOSPITAL_BASED_OUTPATIENT_CLINIC_OR_DEPARTMENT_OTHER): Payer: Self-pay

## 2019-10-22 DIAGNOSIS — T22211A Burn of second degree of right forearm, initial encounter: Secondary | ICD-10-CM | POA: Insufficient documentation

## 2019-10-22 DIAGNOSIS — F17210 Nicotine dependence, cigarettes, uncomplicated: Secondary | ICD-10-CM | POA: Insufficient documentation

## 2019-10-22 DIAGNOSIS — I10 Essential (primary) hypertension: Secondary | ICD-10-CM | POA: Insufficient documentation

## 2019-10-22 DIAGNOSIS — Z7982 Long term (current) use of aspirin: Secondary | ICD-10-CM | POA: Insufficient documentation

## 2019-10-22 DIAGNOSIS — Y999 Unspecified external cause status: Secondary | ICD-10-CM | POA: Insufficient documentation

## 2019-10-22 DIAGNOSIS — Z79899 Other long term (current) drug therapy: Secondary | ICD-10-CM | POA: Insufficient documentation

## 2019-10-22 DIAGNOSIS — Y93G3 Activity, cooking and baking: Secondary | ICD-10-CM | POA: Insufficient documentation

## 2019-10-22 DIAGNOSIS — T2220XA Burn of second degree of shoulder and upper limb, except wrist and hand, unspecified site, initial encounter: Secondary | ICD-10-CM

## 2019-10-22 DIAGNOSIS — Y92090 Kitchen in other non-institutional residence as the place of occurrence of the external cause: Secondary | ICD-10-CM | POA: Insufficient documentation

## 2019-10-22 DIAGNOSIS — X12XXXA Contact with other hot fluids, initial encounter: Secondary | ICD-10-CM | POA: Insufficient documentation

## 2019-10-22 MED ORDER — SILVER SULFADIAZINE 1 % EX CREA: 1.0000 "application " | TOPICAL_CREAM | Freq: Every day | CUTANEOUS | 0 refills | Status: DC

## 2019-10-22 MED ORDER — IBUPROFEN 400 MG PO TABS
600.0000 mg | ORAL_TABLET | Freq: Once | ORAL | Status: AC
  Administered 2019-10-22: 600 mg via ORAL
  Filled 2019-10-22: qty 1

## 2019-10-22 MED FILL — SSD 1% CREAM: 1 | 30 days supply | Qty: 50 | Fill #0

## 2019-10-22 NOTE — Discharge Instructions (Signed)
You can apply the Silvadene cream to your wrist daily to help with pain. Please keep the blisters intact as much as possible. Keep your skin clean to avoid infection. Follow-up with your primary care provider. Return to the ED for signs of infection.

## 2019-10-22 NOTE — ED Provider Notes (Signed)
[facility] EMERGENCY DEPARTMENT Provider Note   CSN: [id number] Arrival date & time: 10/22/19  2683     History Chief Complaint  Patient presents with  . Hand Burn    Janice Bailey is a 49 y.o. female past medical history of hypertension, presenting the emergency department with burn to right forearm that occurred yesterday while cooking.  She states she was holding a large ham with [facility] however the fork slipped on the hand and fell down splashing hot water on her right forearm.  She has had pain and swelling since that time.  Pain is worse with movement and palpation.  She has no burns to her hand or other parts of her body.  She has not treated her pain.  She has been applying a Neosporin ointment for burns as well as aloe vera.  The history is provided by the patient.       Past Medical History:  Diagnosis Date  . Hypertension     There are no problems to display for this patient.   Past Surgical History:  Procedure Laterality Date  . CESAREAN SECTION  2014  . MYOMECTOMY    . TUBAL LIGATION    . UTERINE FIBROID SURGERY       OB History    Gravida  3   Para  1   Term  1   Preterm  0   AB  1   Living  1     SAB  0   TAB  1   Ectopic  0   Multiple  0   Live Births              No family history on file.  Social History   Tobacco Use  . Smoking status: Current Every Day Smoker    Packs/day: 0.50    Types: Cigarettes  . Smokeless tobacco: Never Used  Substance Use Topics  . Alcohol use: Yes    Comment: occassionally   . Drug use: No    Home Medications Prior to Admission medications   Medication Sig Start Date End Date Taking? Authorizing Provider  Aspirin-Salicylamide-Caffeine (ARTHRITIS STRENGTH BC POWDER PO) Take by mouth.    [provider]  cephALEXin (KEFLEX) 500 MG capsule Take 1 capsule (500 mg total) by mouth 4 (four) times daily. 07/29/17   Terrilee Files, MD  cyclobenzaprine (FLEXERIL) 10 MG tablet  Take 1 tablet (10 mg total) by mouth 3 (three) times daily as needed for muscle spasms. 01/04/16   Rolan Bucco, MD  hydrochlorothiazide (HYDRODIURIL) 25 MG tablet Take 1 tablet (25 mg total) by mouth daily. 04/21/18   Terrilee Files, MD  HYDROcodone-acetaminophen (NORCO/VICODIN) 5-325 MG tablet Take 1 tablet by mouth every 6 (six) hours as needed. 01/08/15   Hedges, Tinnie Gens, PA-C  ibuprofen (ADVIL,MOTRIN) 800 MG tablet Take 1 tablet (800 mg total) by mouth 3 (three) times daily. 01/04/16   Rolan Bucco, MD  naproxen (NAPROSYN) 500 MG tablet Take 1 tablet (500 mg total) by mouth 2 (two) times daily. 06/04/14   Kirichenko, Lemont Fillers, PA-C  oxyCODONE-acetaminophen (PERCOCET/ROXICET) 5-325 MG tablet Take 1 tablet by mouth every 6 (six) hours as needed for severe pain. 01/18/15   Mackuen, Courteney Lyn, MD  silver sulfADIAZINE (SILVADENE) 1 % cream Apply 1 application topically daily. 10/22/19   Isobella Ascher, [place] N, PA-C  traMADol (ULTRAM) 50 MG tablet Take 1 tablet (50 mg total) by mouth every 6 (six) hours as needed. 01/04/16   Rolan Bucco,  MD    Allergies    Patient has no known allergies.  Review of Systems   Review of Systems  Skin: Positive for color change.  Allergic/Immunologic: Negative for immunocompromised state.    Physical Exam Updated Vital Signs BP 140/82 (BP Location: Right Arm)   Pulse (!) 52   Resp 15   Ht 5\' 5"  (1.651 m)   Wt 89.2 kg   LMP 10/21/2019   SpO2 99%   BMI 32.73 kg/m   Physical Exam Vitals and nursing note reviewed.  Constitutional:      General: She is not in acute distress.    Appearance: She is well-developed.  HENT:     Head: Normocephalic and atraumatic.  Eyes:     Conjunctiva/sclera: Conjunctivae normal.  Cardiovascular:     Rate and Rhythm: Normal rate.  Pulmonary:     Effort: Pulmonary effort is normal.  Skin:    Comments: Right distal volar forearm with erythematous raised papules/early blisters. TTP. No open wound. Faint redness to  proximal forearm. No involvement of the wrist joint or hand.  Neurological:     Mental Status: She is alert.  Psychiatric:        Mood and Affect: Mood normal.        Behavior: Behavior normal.     ED Results / Procedures / Treatments   Labs (all labs ordered are listed, but only abnormal results are displayed) Labs Reviewed - No data to display  EKG None  Radiology No results found.  Procedures Procedures (including critical care time)  Medications Ordered in ED Medications  ibuprofen (ADVIL) tablet 600 mg (600 mg Oral Given 10/22/19 1205)    ED Course  I have reviewed the triage vital signs and the nursing notes.  Pertinent labs & imaging results that were available during my care of the patient were reviewed by me and considered in my medical decision making (see chart for details).    MDM Rules/Calculators/A&P                         Pt with superficial and superficial partial thickness burn to forearm.  No joint or hand involvement.  No open wounds.  Recommend continued symptomatic management and wound care.  OTC medications as needed for pain. Silvadene prescribed.  PCP follow-up.  Return precautions discussed.  Patient is agreeable to plan, appropriate for discharge.   Final Clinical Impression(s) / ED Diagnoses Final diagnoses:  Superficial partial thickness burn of upper extremity    Rx / DC Orders ED Discharge Orders         Ordered    silver sulfADIAZINE (SILVADENE) 1 % cream  Daily     Discontinue  Reprint     10/22/19 1202           Leni Pankonin, 10/24/19 N, PA-C 10/22/19 1240    10/24/19, MD 10/22/19 1327

## 2019-10-22 NOTE — ED Triage Notes (Signed)
Pt arrives ambulatory to ED with c/o right hand/wrist pain and swelling after being burnt while cooking yesterday.

## 2019-11-05 ENCOUNTER — Emergency Department (HOSPITAL_BASED_OUTPATIENT_CLINIC_OR_DEPARTMENT_OTHER)
Admission: EM | Admit: 2019-11-05 | Discharge: 2019-11-05 | Disposition: A | Discharge status: Home / Self Care | Payer: PRIVATE HEALTH INSURANCE | Attending: Emergency Medicine | Admitting: Emergency Medicine

## 2019-11-05 ENCOUNTER — Encounter (HOSPITAL_BASED_OUTPATIENT_CLINIC_OR_DEPARTMENT_OTHER): Payer: Self-pay | Admitting: *Deleted

## 2019-11-05 ENCOUNTER — Other Ambulatory Visit: Payer: Self-pay

## 2019-11-05 DIAGNOSIS — I10 Essential (primary) hypertension: Secondary | ICD-10-CM | POA: Diagnosis not present

## 2019-11-05 DIAGNOSIS — R109 Unspecified abdominal pain: Secondary | ICD-10-CM | POA: Diagnosis not present

## 2019-11-05 DIAGNOSIS — Z79899 Other long term (current) drug therapy: Secondary | ICD-10-CM | POA: Insufficient documentation

## 2019-11-05 DIAGNOSIS — R350 Frequency of micturition: Secondary | ICD-10-CM | POA: Diagnosis not present

## 2019-11-05 DIAGNOSIS — M545 Low back pain, unspecified: Secondary | ICD-10-CM

## 2019-11-05 DIAGNOSIS — F17210 Nicotine dependence, cigarettes, uncomplicated: Secondary | ICD-10-CM | POA: Insufficient documentation

## 2019-11-05 DIAGNOSIS — Z7982 Long term (current) use of aspirin: Secondary | ICD-10-CM | POA: Insufficient documentation

## 2019-11-05 LAB — URINALYSIS, MICROSCOPIC (REFLEX)

## 2019-11-05 LAB — URINALYSIS, ROUTINE W REFLEX MICROSCOPIC
Bilirubin Urine: NEGATIVE
Glucose, UA: NEGATIVE mg/dL
Ketones, ur: NEGATIVE mg/dL
Leukocytes,Ua: NEGATIVE
Nitrite: NEGATIVE
Protein, ur: NEGATIVE mg/dL
Specific Gravity, Urine: 1.015 (ref 1.005–1.030)
pH: 7.5 (ref 5.0–8.0)

## 2019-11-05 MED ORDER — CYCLOBENZAPRINE HCL 10 MG PO TABS: 10.0000 mg | ORAL_TABLET | Freq: Three times a day (TID) | ORAL | 0 refills | Status: DC | PRN

## 2019-11-05 MED ORDER — LIDOCAINE 5 % EX PTCH: 1.0000 | MEDICATED_PATCH | CUTANEOUS | 0 refills | Status: AC

## 2019-11-05 MED ORDER — METHYLPREDNISOLONE 4 MG PO TBPK: ORAL_TABLET | ORAL | 0 refills | Status: DC

## 2019-11-05 MED ORDER — OXYCODONE HCL 5 MG PO TABS: 5.0000 mg | ORAL_TABLET | ORAL | 0 refills | Status: DC | PRN

## 2019-11-05 MED ORDER — IBUPROFEN 600 MG PO TABS: 600.0000 mg | ORAL_TABLET | Freq: Four times a day (QID) | ORAL | 0 refills | Status: DC | PRN

## 2019-11-05 MED ORDER — DEXAMETHASONE SODIUM PHOSPHATE 10 MG/ML IJ SOLN
10.0000 mg | Freq: Once | INTRAMUSCULAR | Status: AC
  Administered 2019-11-05: 10 mg via INTRAMUSCULAR
  Filled 2019-11-05: qty 1

## 2019-11-05 MED ORDER — KETOROLAC TROMETHAMINE 60 MG/2ML IM SOLN
60.0000 mg | Freq: Once | INTRAMUSCULAR | Status: AC
  Administered 2019-11-05: 60 mg via INTRAMUSCULAR
  Filled 2019-11-05: qty 2

## 2019-11-05 MED FILL — IBUPROFEN 600 MG TABLET: 600 | 7 days supply | Qty: 30 | Fill #0

## 2019-11-05 MED FILL — CYCLOBENZAPRINE HCL 10 MG T: 10 | 6 days supply | Qty: 20 | Fill #0

## 2019-11-05 MED FILL — oxyCODONE HCL 5 MG TABS: 5 | 2 days supply | Qty: 12 | Fill #0

## 2019-11-05 MED FILL — METHYLPREDNISOLONE 4 MG TBP: 4 | 6 days supply | Qty: 21 | Fill #0

## 2019-11-05 NOTE — ED Triage Notes (Signed)
Pt also reports urinary frequency, amb to rr for cc urine specimen.

## 2019-11-05 NOTE — ED Provider Notes (Signed)
[facility] EMERGENCY DEPARTMENT Provider Note   CSN: [id number] Arrival date & time: 11/05/19  0973     History Chief Complaint  Patient presents with  . Back Pain    Janice Bailey is a 49 y.o. female.  HPI     Bilateral low back pain, 10/10, sharp Worse with bending, moving, somewhat better standing No urinary symptoms, frequency but no burning On menses, tylenol for cramping, didn't help No numbness/weakness No loss of control of bowel or bladder No trauma, no falls  No hx of cancer hx, IVDU, fevers No abdominal pain   Past Medical History:  Diagnosis Date  . Hypertension     There are no problems to display for this patient.   Past Surgical History:  Procedure Laterality Date  . CESAREAN SECTION  2014  . MYOMECTOMY    . TUBAL LIGATION    . UTERINE FIBROID SURGERY       OB History    Gravida  3   Para  1   Term  1   Preterm  0   AB  1   Living  1     SAB  0   TAB  1   Ectopic  0   Multiple  0   Live Births              History reviewed. No pertinent family history.  Social History   Tobacco Use  . Smoking status: Current Every Day Smoker    Packs/day: 0.50    Types: Cigarettes  . Smokeless tobacco: Never Used  Substance Use Topics  . Alcohol use: Yes    Comment: occassionally   . Drug use: No    Home Medications Prior to Admission medications   Medication Sig Start Date End Date Taking? Authorizing Provider  Aspirin-Salicylamide-Caffeine (ARTHRITIS STRENGTH BC POWDER PO) Take by mouth.    [provider]  cyclobenzaprine (FLEXERIL) 10 MG tablet Take 1 tablet (10 mg total) by mouth 3 (three) times daily as needed for muscle spasms. 11/05/19   Alvira Monday, MD  hydrochlorothiazide (HYDRODIURIL) 25 MG tablet Take 1 tablet (25 mg total) by mouth daily. 04/21/18   Terrilee Files, MD  ibuprofen (ADVIL) 600 MG tablet Take 1 tablet (600 mg total) by mouth every 6 (six) hours as needed. 11/05/19    Alvira Monday, MD  lidocaine (LIDODERM) 5 % Place 1 patch onto the skin daily. Remove & Discard patch within 12 hours or as directed by MD 11/05/19   Alvira Monday, MD  methylPREDNISolone (MEDROL DOSEPAK) 4 MG TBPK tablet See instructions on pack 11/05/19   Alvira Monday, MD  oxyCODONE (ROXICODONE) 5 MG immediate release tablet Take 1 tablet (5 mg total) by mouth every 4 (four) hours as needed for severe pain. 11/05/19   Alvira Monday, MD  oxyCODONE-acetaminophen (PERCOCET/ROXICET) 5-325 MG tablet Take 1 tablet by mouth every 6 (six) hours as needed for severe pain. 01/18/15   Mackuen, Courteney Lyn, MD  silver sulfADIAZINE (SILVADENE) 1 % cream Apply 1 application topically daily. 10/22/19   Robinson, [place] N, PA-C    Allergies    Patient has no known allergies.  Review of Systems   Review of Systems  Constitutional: Negative for fever.  HENT: Negative for sore throat.   Eyes: Negative for visual disturbance.  Respiratory: Negative for cough and shortness of breath.   Cardiovascular: Negative for chest pain.  Gastrointestinal: Negative for abdominal pain, diarrhea, nausea and vomiting.  Genitourinary: Positive for flank  pain and frequency. Negative for difficulty urinating.  Musculoskeletal: Positive for back pain. Negative for neck pain.  Skin: Negative for rash.  Neurological: Negative for weakness and numbness.    Physical Exam Updated Vital Signs BP (!) 154/79 (BP Location: Right Arm)   Pulse 61   Temp 98.1 F (36.7 C) (Oral)   Resp 16   Ht 5\' 6"  (1.676 m)   Wt 90.5 kg   LMP 10/21/2019   SpO2 98%   BMI 32.20 kg/m   Physical Exam Vitals and nursing note reviewed.  Constitutional:      General: She is not in acute distress.    Appearance: Normal appearance. She is not ill-appearing, toxic-appearing or diaphoretic.  HENT:     Head: Normocephalic.  Eyes:     Conjunctiva/sclera: Conjunctivae normal.  Cardiovascular:     Rate and Rhythm: Normal rate and regular  rhythm.     Pulses: Normal pulses.  Pulmonary:     Effort: Pulmonary effort is normal. No respiratory distress.  Musculoskeletal:        General: No deformity or signs of injury.     Cervical back: No rigidity.  Skin:    General: Skin is warm and dry.     Coloration: Skin is not jaundiced or pale.  Neurological:     General: No focal deficit present.     Mental Status: She is alert and oriented to person, place, and time.     ED Results / Procedures / Treatments   Labs (all labs ordered are listed, but only abnormal results are displayed) Labs Reviewed  URINALYSIS, ROUTINE W REFLEX MICROSCOPIC - Abnormal; Notable for the following components:      Result Value   Hgb urine dipstick LARGE (*)    All other components within normal limits  URINALYSIS, MICROSCOPIC (REFLEX) - Abnormal; Notable for the following components:   Bacteria, UA FEW (*)    All other components within normal limits    EKG None  Radiology No results found.  Procedures Procedures (including critical care time)  Medications Ordered in ED Medications  ketorolac (TORADOL) injection 60 mg (60 mg Intramuscular Given 11/05/19 0853)  dexamethasone (DECADRON) injection 10 mg (10 mg Intramuscular Given 11/05/19 01/05/20)    ED Course  I have reviewed the triage vital signs and the nursing notes.  Pertinent labs & imaging results that were available during my care of the patient were reviewed by me and considered in my medical decision making (see chart for details).    MDM Rules/Calculators/A&P                          49yo female with history of htn presents with concern for back pain.  Patient has a normal neurologic exam and denies any urinary retention or overflow incontinence, stool incontinence, saddle anesthesia, fever, IV drug use, trauma, chronic steroid use or immunocompromise and have low suspicion suspicion for cauda equina, fracture, epidural abscess, or vertebral osteomyelitis.  Back pain lower,  worse with movements and not colicky and doubt nephrolithiasis. No sign of pyelonephritis on UA. No sign of intraabdominal etiology of pain.  Suspect likely disc herniation as etiology of pain. Given rx for medrol dose pack, flexeril, and after discussion of risks and benefits and review in [place] drug database, oxycodone for breakthrough pain.  Patient discharged in stable condition with understanding of reasons to return.      Final Clinical Impression(s) / ED Diagnoses Final diagnoses:  Acute bilateral low back pain without sciatica    Rx / DC Orders ED Discharge Orders         Ordered    cyclobenzaprine (FLEXERIL) 10 MG tablet  3 times daily PRN     Discontinue  Reprint     11/05/19 0842    lidocaine (LIDODERM) 5 %  Every 24 hours     Discontinue  Reprint     11/05/19 0842    methylPREDNISolone (MEDROL DOSEPAK) 4 MG TBPK tablet     Discontinue  Reprint     11/05/19 0842    oxyCODONE (ROXICODONE) 5 MG immediate release tablet  Every 4 hours PRN     Discontinue  Reprint     11/05/19 0842    ibuprofen (ADVIL) 600 MG tablet  Every 6 hours PRN     Discontinue  Reprint     11/05/19 2947           Alvira Monday, MD 11/05/19 1707

## 2019-11-05 NOTE — ED Triage Notes (Signed)
Pt reports sudden onset of bilateral low back pain last night, denies any injury or trauma, "feels like spasms."

## 2021-06-23 ENCOUNTER — Emergency Department (HOSPITAL_BASED_OUTPATIENT_CLINIC_OR_DEPARTMENT_OTHER): Payer: Medicaid Other

## 2021-06-23 ENCOUNTER — Other Ambulatory Visit: Payer: Self-pay

## 2021-06-23 ENCOUNTER — Other Ambulatory Visit (HOSPITAL_BASED_OUTPATIENT_CLINIC_OR_DEPARTMENT_OTHER): Payer: Self-pay

## 2021-06-23 ENCOUNTER — Emergency Department (HOSPITAL_BASED_OUTPATIENT_CLINIC_OR_DEPARTMENT_OTHER)
Admission: EM | Admit: 2021-06-23 | Discharge: 2021-06-23 | Disposition: A | Discharge status: Home / Self Care | Payer: Medicaid Other | Attending: Emergency Medicine | Admitting: Emergency Medicine

## 2021-06-23 ENCOUNTER — Encounter (HOSPITAL_BASED_OUTPATIENT_CLINIC_OR_DEPARTMENT_OTHER): Payer: Self-pay

## 2021-06-23 DIAGNOSIS — M25551 Pain in right hip: Secondary | ICD-10-CM | POA: Diagnosis present

## 2021-06-23 DIAGNOSIS — Z7982 Long term (current) use of aspirin: Secondary | ICD-10-CM | POA: Insufficient documentation

## 2021-06-23 DIAGNOSIS — Z79899 Other long term (current) drug therapy: Secondary | ICD-10-CM | POA: Diagnosis not present

## 2021-06-23 DIAGNOSIS — M8938 Hypertrophy of bone, other site: Secondary | ICD-10-CM | POA: Diagnosis not present

## 2021-06-23 DIAGNOSIS — M24851 Other specific joint derangements of right hip, not elsewhere classified: Secondary | ICD-10-CM | POA: Diagnosis not present

## 2021-06-23 DIAGNOSIS — I10 Essential (primary) hypertension: Secondary | ICD-10-CM | POA: Insufficient documentation

## 2021-06-23 DIAGNOSIS — M479 Spondylosis, unspecified: Secondary | ICD-10-CM | POA: Diagnosis not present

## 2021-06-23 DIAGNOSIS — M47816 Spondylosis without myelopathy or radiculopathy, lumbar region: Secondary | ICD-10-CM

## 2021-06-23 DIAGNOSIS — M25851 Other specified joint disorders, right hip: Secondary | ICD-10-CM

## 2021-06-23 MED ORDER — DICLOFENAC SODIUM 50 MG PO TBEC
50.0000 mg | DELAYED_RELEASE_TABLET | Freq: Two times a day (BID) | ORAL | 0 refills | Status: AC
  Filled 2021-06-23: qty 20, 10d supply, fill #0

## 2021-06-23 MED ORDER — KETOROLAC TROMETHAMINE 30 MG/ML IJ SOLN
12.0000 mg | Freq: Once | INTRAMUSCULAR | Status: AC
  Administered 2021-06-23: 12 mg via INTRAMUSCULAR
  Filled 2021-06-23: qty 1

## 2021-06-23 MED ORDER — METHOCARBAMOL 500 MG PO TABS
500.0000 mg | ORAL_TABLET | Freq: Two times a day (BID) | ORAL | 0 refills | Status: DC
  Filled 2021-06-23: qty 20, 10d supply, fill #0

## 2021-06-23 NOTE — ED Provider Notes (Signed)
?[facility] ?Provider Note ? ? ?CSN: [id number] ?Arrival date & time: 06/23/21  0907 ? ?  ? ?History ? ?Chief Complaint  ?Patient presents with  ? Leg Pain  ? ? ?Janice Bailey is a 51 y.o. female. ? ?51 year old female with past medical history of hypertension presents with complaint of pain in her anterior right hip area as well as numbness from her anterior knee extending down her anterior shin to her medial right ankle.  Symptoms started 3 months ago without injury.  Patient went to her PCP, last seen 1 month ago, given gabapentin and naproxen which she has been taking for a month without any improvement in her symptoms.  Patient is awaiting follow-up with neurology, currently scheduled for May 12.  Patient states that her pain is affecting her ability to do her job, is ambulatory with a limp.  She denies abdominal pain.  No other complaints or concerns. ? ? ?  ? ?Home Medications ?Prior to Admission medications   ?Medication Sig Start Date End Date Taking? Authorizing Provider  ?diclofenac (VOLTAREN) 50 MG EC tablet Take 1 tablet (50 mg total) by mouth 2 (two) times daily for 10 days. 06/23/21 07/03/21 Yes Tacy Learn, PA-C  ?methocarbamol (ROBAXIN) 500 MG tablet Take 1 tablet (500 mg total) by mouth 2 (two) times daily. 06/23/21  Yes Tacy Learn, PA-C  ?Aspirin-Salicylamide-Caffeine (ARTHRITIS STRENGTH BC POWDER PO) Take by mouth.    [provider]  ?hydrochlorothiazide (HYDRODIURIL) 25 MG tablet Take 1 tablet (25 mg total) by mouth daily. 04/21/18   Hayden Rasmussen, MD  ?lidocaine (LIDODERM) 5 % Place 1 patch onto the skin daily. Remove & Discard patch within 12 hours or as directed by MD 11/05/19   Gareth Morgan, MD  ?   ? ?Allergies    ?Patient has no known allergies.   ? ?Review of Systems   ?Review of Systems ?Negative except as per HPI ?Physical Exam ?Updated Vital Signs ?BP (!) 145/91 (BP Location: Right Arm)   Pulse 60   Temp 97.9 ?F (36.6 ?C) (Oral)    Resp 18   Ht 5\' 6"  (1.676 m)   Wt 83.9 kg   SpO2 97%   BMI 29.86 kg/m?  ?Physical Exam ?Vitals and nursing note reviewed.  ?Constitutional:   ?   General: She is not in acute distress. ?   Appearance: She is well-developed. She is not diaphoretic.  ?HENT:  ?   Head: Normocephalic and atraumatic.  ?Cardiovascular:  ?   Pulses: Normal pulses.  ?Pulmonary:  ?   Effort: Pulmonary effort is normal.  ?Abdominal:  ?   Palpations: Abdomen is soft.  ?   Tenderness: There is no abdominal tenderness.  ?Musculoskeletal:     ?   General: Swelling present. No tenderness or deformity.  ?   Right lower leg: No edema.  ?   Left lower leg: No edema.  ?   Comments: Normal ROM right hip, patient reports dull pain while lying in bed that is worse bearing weight.  ?Reports novocain altered sensation to the anterior/medial/lateral knee, extending to anterior shin to medial right ankle. Not involving the foot.  ?Normal dorsi/plantar flexion, normal great toe strength. ?Patellar reflex 1+ on right, 2+ on left.  ?Able to flex hip, knee, straight leg raise on right   ?Skin: ?   General: Skin is warm and dry.  ?   Findings: No erythema or rash.  ?Neurological:  ?   Mental  Status: She is alert and oriented to person, place, and time.  ?   Deep Tendon Reflexes: Babinski sign absent on the right side. Babinski sign absent on the left side.  ?   Reflex Scores: ?     Patellar reflexes are 1+ on the right side and 2+ on the left side. ?     Achilles reflexes are 1+ on the right side and 1+ on the left side. ?Psychiatric:     ?   Behavior: Behavior normal.  ? ? ?ED Results / Procedures / Treatments   ?Labs ?(all labs ordered are listed, but only abnormal results are displayed) ?Labs Reviewed - No data to display ? ?EKG ?None ? ?Radiology ?DG Lumbar Spine Complete ? ?Result Date: 06/23/2021 ?CLINICAL DATA:  Back pain, right hip pain EXAM: LUMBAR SPINE - COMPLETE 4+ VIEW COMPARISON:  07/08/2020 FINDINGS: No recent fracture is seen. Alignment of  posterior margins of vertebral bodies is unremarkable. Marked degenerative changes are noted at L4-L5 level with disc space narrowing, bony spurs and facet hypertrophy. Facet hypertrophy is also noted at L5-S1 level. Overall, no significant interval changes are noted. IMPRESSION: No recent fracture is seen. Degenerative changes are noted in the lumbar spine, particularly prominent at L4-L5 and L5-S1 levels. Electronically Signed   By: Elmer Picker M.D.   On: 06/23/2021 10:41  ? ?DG Hip Unilat With Pelvis 2-3 Views Right ? ?Result Date: 06/23/2021 ?CLINICAL DATA:  Right hip pain EXAM: DG HIP (WITH OR WITHOUT PELVIS) 2-3V RIGHT COMPARISON:  07/08/2020 FINDINGS: Bony pelvis and hips appear symmetric and intact. No hip fracture or malalignment. Similar superolateral acetabular bony overgrowth bilaterally, worse on the right which can be seen with femoroacetabular impingement syndrome. Normal SI joints for age. Lumbosacral spine unremarkable. Nonobstructive bowel gas pattern. IMPRESSION: No acute osseous finding. Negative for fracture. See above comment. Electronically Signed   By: Jerilynn Mages.  Shick M.D.   On: 06/23/2021 10:42   ? ?Procedures ?Procedures  ? ? ?Medications Ordered in ED ?Medications  ?ketorolac (TORADOL) 30 MG/ML injection 12 mg (12 mg Intramuscular Given 06/23/21 1056)  ? ? ?ED Course/ Medical Decision Making/ A&P ?  ?                        ?Medical Decision Making ?Amount and/or Complexity of Data Reviewed ?Radiology: ordered. ? ? ?This patient presents to the ED for concern of pain in her right hip with numbness to the lower right leg, this involves an extensive number of treatment options, and is a complaint that carries with it a high risk of complications and morbidity.  The differential diagnosis includes but not limited to nerve impingement, osteoarthritis, degenerative disc disease, AVN ? ? ?Co morbidities that complicate the patient evaluation ? ?HTN ? ? ?Additional history obtained: ? ?Additional  history obtained from patient's coworker at bedside who states patient is experiencing increasing pain and walking with a limp ?External records from outside source obtained and reviewed including recent pelvic ultrasound from 05/28/2021 with multiple uterine fibroids ? ?Imaging Studies ordered: ? ?I ordered imaging studies including x-ray lumbar spine and right hip ?I independently visualized and interpreted imaging which showed degenerative changes of the lumbar spine, right hip with acetabular bony overgrowth ?I agree with the radiologist interpretation ? ? ?Problem List / ED Course / Critical interventions / Medication management ? ?51 year old female with complaint of pain in her right groin and anterior right hip area as well as altered sensation to  her anterior right lower leg as above.  Does have diminished patellar reflex on the right compared to left however strength of the leg is symmetric with strong DP pulses bilaterally.  X-rays with degenerative changes.  Patient is given IM and Toradol while in the ER, discharged with diclofenac and Robaxin.  Discussed using caution with Robaxin with her current Neurontin, consider tapering off of the Neurontin as she does not feel this is helped her despite 1 month of regular use.  Patient did take it discontinue her naproxen.  Patient is referred to sports medicine for follow-up regarding her x-ray changes today.  Encouraged to keep her appointment with neurology. ?I ordered medication including Toradol for pain ?Reevaluation of the patient after these medicines showed that the patient stayed the same ?I have reviewed the patients home medicines and have made adjustments as needed ? ? ? ? ? ? ? ? ?Final Clinical Impression(s) / ED Diagnoses ?Final diagnoses:  ?Femoroacetabular impingement of right hip  ?Facet hypertrophy of lumbar region  ?Osteoarthritis of lumbar spine, unspecified spinal osteoarthritis complication status  ? ? ?Rx / DC Orders ?ED Discharge Orders    ? ?      Ordered  ?  methocarbamol (ROBAXIN) 500 MG tablet  2 times daily       ? 06/23/21 1057  ?  diclofenac (VOLTAREN) 50 MG EC tablet  2 times daily       ? 06/23/21 1057  ? ?  ?  ? ?  ? ? ?  ?Shanon Rosser

## 2021-06-23 NOTE — ED Triage Notes (Signed)
C/o right leg pain x 3 months. States has seen pcp for same. States was worse today. ?

## 2021-06-23 NOTE — Discharge Instructions (Addendum)
Prescription for Robaxin.  This is a muscle relaxant.  Combined with gabapentin, this can make you more drowsy or unsteady.  Do not take unless you are with someone who can assist if needed, discontinue use if this medication affects you poorly. ?Discontinue the naproxen. ?Prescription given for diclofenac. ?Follow-up with orthopedics, given referral, call today to schedule an appointment.  Follow-up with your neurologist as previously scheduled. ?

## 2021-06-30 ENCOUNTER — Ambulatory Visit: Payer: PRIVATE HEALTH INSURANCE | Admitting: Family Medicine

## 2022-07-20 ENCOUNTER — Encounter: Payer: Self-pay | Admitting: *Deleted

## 2022-12-10 ENCOUNTER — Other Ambulatory Visit: Payer: Self-pay

## 2022-12-10 ENCOUNTER — Encounter (HOSPITAL_BASED_OUTPATIENT_CLINIC_OR_DEPARTMENT_OTHER): Payer: Self-pay

## 2022-12-10 ENCOUNTER — Other Ambulatory Visit (HOSPITAL_BASED_OUTPATIENT_CLINIC_OR_DEPARTMENT_OTHER): Payer: Self-pay

## 2022-12-10 ENCOUNTER — Emergency Department (HOSPITAL_BASED_OUTPATIENT_CLINIC_OR_DEPARTMENT_OTHER)
Admission: EM | Admit: 2022-12-10 | Discharge: 2022-12-10 | Disposition: A | Discharge status: Home / Self Care | Payer: Medicaid Other | Attending: Emergency Medicine | Admitting: Emergency Medicine

## 2022-12-10 DIAGNOSIS — K0889 Other specified disorders of teeth and supporting structures: Secondary | ICD-10-CM | POA: Diagnosis present

## 2022-12-10 DIAGNOSIS — Z7982 Long term (current) use of aspirin: Secondary | ICD-10-CM | POA: Insufficient documentation

## 2022-12-10 MED ORDER — OXYCODONE HCL 5 MG PO TABS
5.0000 mg | ORAL_TABLET | Freq: Two times a day (BID) | ORAL | 0 refills | Status: DC | PRN
  Filled 2022-12-10: qty 10, 5d supply, fill #0

## 2022-12-10 MED ORDER — FLUCONAZOLE 200 MG PO TABS
200.0000 mg | ORAL_TABLET | Freq: Every day | ORAL | 0 refills | Status: DC | PRN
  Filled 2022-12-10: qty 1, 1d supply, fill #0

## 2022-12-10 MED ORDER — IBUPROFEN 600 MG PO TABS
600.0000 mg | ORAL_TABLET | Freq: Four times a day (QID) | ORAL | 0 refills | Status: AC | PRN
  Filled 2022-12-10: qty 30, 8d supply, fill #0

## 2022-12-10 MED ORDER — PENICILLIN V POTASSIUM 500 MG PO TABS
500.0000 mg | ORAL_TABLET | Freq: Four times a day (QID) | ORAL | 0 refills | Status: AC
  Filled 2022-12-10: qty 28, 7d supply, fill #0

## 2022-12-10 NOTE — ED Triage Notes (Signed)
The patient stated a filling fell out of her tooth and she is now having pain and is concerned she has an abscess. She is having issues getting in to see the dentist.

## 2022-12-10 NOTE — ED Provider Notes (Signed)
[facility] Provider Note   CSN: [id number] Arrival date & time: 12/10/22  1037     History  Chief Complaint  Patient presents with   Dental Pain    Janice Bailey is a 52 y.o. female presented to the ED with dental pain on her left upper molar.  She reports he had a filling that which she thinks fell out.  She is now hypersensitive to food and drinks and is concerned that this may be get infected.  She said that her former dentist will not see her anymore because she is now on Medicaid.  She is trying to search for a new dental clinic to go.  HPI     Home Medications Prior to Admission medications   Medication Sig Start Date End Date Taking? Authorizing Provider  fluconazole (DIFLUCAN) 200 MG tablet Take 1 tablet (200 mg total) by mouth daily as needed for up to 1 dose. 12/10/22  Yes Ryett Hamman, Kermit Balo, MD  ibuprofen (ADVIL) 600 MG tablet Take 1 tablet (600 mg total) by mouth every 6 (six) hours as needed for moderate pain or mild pain. 12/10/22 01/09/23 Yes Ahmya Bernick, Kermit Balo, MD  oxyCODONE (ROXICODONE) 5 MG immediate release tablet Take 1 tablet (5 mg total) by mouth every 12 (twelve) hours as needed for up to 10 doses for severe pain. 12/10/22  Yes Marvelene Stoneberg, Kermit Balo, MD  penicillin v potassium (VEETID) 500 MG tablet Take 1 tablet (500 mg total) by mouth 4 (four) times daily for 7 days. 12/10/22 12/17/22 Yes Ronit Marczak, Kermit Balo, MD  Aspirin-Salicylamide-Caffeine (ARTHRITIS STRENGTH BC POWDER PO) Take by mouth.    [provider]  hydrochlorothiazide (HYDRODIURIL) 25 MG tablet Take 1 tablet (25 mg total) by mouth daily. 04/21/18   Terrilee Files, MD  lidocaine (LIDODERM) 5 % Place 1 patch onto the skin daily. Remove & Discard patch within 12 hours or as directed by MD 11/05/19   Alvira Monday, MD  methocarbamol (ROBAXIN) 500 MG tablet Take 1 tablet (500 mg total) by mouth 2 (two) times daily. 06/23/21   Jeannie Fend, PA-C       Allergies    Patient has no known allergies.    Review of Systems   Review of Systems  Physical Exam Updated Vital Signs BP (!) 140/90   Pulse (!) 52   Temp (!) 97.5 F (36.4 C) (Oral)   Resp 18   Ht 5\' 6"  (1.676 m)   Wt 88.9 kg   SpO2 100%   BMI 31.64 kg/m  Physical Exam Constitutional:      General: She is not in acute distress. HENT:     Head: Normocephalic and atraumatic.     Comments: Left posterior upper molar with discoloration, prior filling missing, no visible fluctuant abscess or involvement of the gumline Eyes:     Conjunctiva/sclera: Conjunctivae normal.     Pupils: Pupils are equal, round, and reactive to light.  Cardiovascular:     Rate and Rhythm: Normal rate and regular rhythm.  Pulmonary:     Effort: Pulmonary effort is normal. No respiratory distress.  Abdominal:     General: There is no distension.     Tenderness: There is no abdominal tenderness.  Skin:    General: Skin is warm and dry.  Neurological:     General: No focal deficit present.     Mental Status: She is alert. Mental status is at baseline.  Psychiatric:  Mood and Affect: Mood normal.        Behavior: Behavior normal.     ED Results / Procedures / Treatments   Labs (all labs ordered are listed, but only abnormal results are displayed) Labs Reviewed - No data to display  EKG None  Radiology No results found.  Procedures Procedures    Medications Ordered in ED Medications - No data to display  ED Course/ Medical Decision Making/ A&P                                 Medical Decision Making Risk Prescription drug management.   Patient is here with concern for potential dental infection versus dental pain - filling fell out  Patient will be started on penicillin here for 7 days as prophylaxis, although explained that is not clear if this is truly an infection at this time, may be related to hypersensitivity instead due to the loss of the feeling.  The patient  does need follow-up with a dentist and I will provide dental resource guide for them as well.  A short course of pain medication provided for pain relief particularly at night.  Patient verbalized understanding with the plan  I doubt Ludwig's angina or deep space infection.  No indication for CT imaging or blood test at this time        Final Clinical Impression(s) / ED Diagnoses Final diagnoses:  Pain, dental    Rx / DC Orders ED Discharge Orders          Ordered    oxyCODONE (ROXICODONE) 5 MG immediate release tablet  Every 12 hours PRN        12/10/22 1114    ibuprofen (ADVIL) 600 MG tablet  Every 6 hours PRN        12/10/22 1114    fluconazole (DIFLUCAN) 200 MG tablet  Daily PRN       Note to Pharmacy: Can offer 150 mg diflucan if alternative dose is needed   12/10/22 1114    penicillin v potassium (VEETID) 500 MG tablet  4 times daily        12/10/22 1114              Teshawn Moan, Kermit Balo, MD 12/10/22 1115

## 2023-07-18 ENCOUNTER — Other Ambulatory Visit (HOSPITAL_BASED_OUTPATIENT_CLINIC_OR_DEPARTMENT_OTHER): Payer: Self-pay

## 2023-07-18 ENCOUNTER — Other Ambulatory Visit: Payer: Self-pay

## 2023-07-18 ENCOUNTER — Emergency Department (HOSPITAL_BASED_OUTPATIENT_CLINIC_OR_DEPARTMENT_OTHER)
Admission: EM | Admit: 2023-07-18 | Discharge: 2023-07-18 | Disposition: A | Discharge status: Home / Self Care | Attending: Emergency Medicine | Admitting: Emergency Medicine

## 2023-07-18 DIAGNOSIS — K0889 Other specified disorders of teeth and supporting structures: Secondary | ICD-10-CM | POA: Diagnosis present

## 2023-07-18 DIAGNOSIS — K047 Periapical abscess without sinus: Secondary | ICD-10-CM | POA: Insufficient documentation

## 2023-07-18 MED ORDER — FLUCONAZOLE 150 MG PO TABS
150.0000 mg | ORAL_TABLET | Freq: Every day | ORAL | 0 refills | Status: AC
  Filled 2023-07-18: qty 1, 1d supply, fill #0

## 2023-07-18 MED ORDER — PENICILLIN V POTASSIUM 500 MG PO TABS
500.0000 mg | ORAL_TABLET | Freq: Three times a day (TID) | ORAL | 0 refills | Status: AC
  Filled 2023-07-18: qty 21, 7d supply, fill #0

## 2023-07-18 MED ORDER — OXYCODONE HCL 5 MG PO TABS
5.0000 mg | ORAL_TABLET | Freq: Four times a day (QID) | ORAL | 0 refills | Status: AC | PRN
  Filled 2023-07-18: qty 6, 2d supply, fill #0

## 2023-07-18 NOTE — ED Notes (Signed)
 Pt advised she had multiple fillings placed from a prior dentist and now they have fallen out and she's in chronic pain.

## 2023-07-18 NOTE — ED Provider Notes (Signed)
 [facility] Provider Note   CSN: [id number] Arrival date & time: 07/18/23  1121     History  Chief Complaint  Patient presents with   Dental Pain    Janice Bailey is a 53 y.o. female.  Patient presents to the emergency department today for evaluation of dental pain.  Patient reports increased swelling and pain after chewing on the left side of her mouth yesterday.  She has had problems with the upper and lower molars on this side.  No difficulty breathing or swallowing.  She reports cavities in these teeth.  Home meds not helping.  Seen in September 2024 for same, treated with penicillin.  Patient currently does not have a dentist.       Home Medications Prior to Admission medications   Medication Sig Start Date End Date Taking? Authorizing Provider  Aspirin-Salicylamide-Caffeine (ARTHRITIS STRENGTH BC POWDER PO) Take by mouth.    [provider]  fluconazole (DIFLUCAN) 150 MG tablet Take 1 tablet (150 mg total) by mouth daily. 07/18/23  Yes Renne Crigler, PA-C  hydrochlorothiazide (HYDRODIURIL) 25 MG tablet Take 1 tablet (25 mg total) by mouth daily. 04/21/18   Terrilee Files, MD  lidocaine (LIDODERM) 5 % Place 1 patch onto the skin daily. Remove & Discard patch within 12 hours or as directed by MD 11/05/19   Alvira Monday, MD  oxyCODONE (OXY IR/ROXICODONE) 5 MG immediate release tablet Take 1 tablet (5 mg total) by mouth every 6 (six) hours as needed for severe pain (pain score 7-10). 07/18/23  Yes Renne Crigler, PA-C  penicillin v potassium (VEETID) 500 MG tablet Take 1 tablet (500 mg total) by mouth 3 (three) times daily. 07/18/23  Yes Renne Crigler, PA-C      Allergies    Patient has no known allergies.    Review of Systems   Review of Systems  Physical Exam Updated Vital Signs BP (!) 168/89 (BP Location: Right Arm)   Pulse 78   Temp 98.1 F (36.7 C) (Oral)   Resp 16   Ht 5\' 6"  (1.676 m)   Wt 88.9 kg    SpO2 100%   BMI 31.64 kg/m  Physical Exam Vitals and nursing note reviewed.  Constitutional:      Appearance: She is well-developed.  HENT:     Head: Normocephalic and atraumatic.     Jaw: No trismus.     Right Ear: Tympanic membrane, ear canal and external ear normal.     Left Ear: Tympanic membrane, ear canal and external ear normal.     Nose: Nose normal.     Mouth/Throat:     Dentition: Abnormal dentition. Dental caries present. No dental abscesses.     Pharynx: Uvula midline. No uvula swelling.     Tonsils: No tonsillar abscesses.     Comments: Cavities noted in the posterior molars on the left side.  There is a fair amount of swelling of the gumline posterior and outside of tooth #16.  No gross abscess palpated. Eyes:     Conjunctiva/sclera: Conjunctivae normal.  Neck:     Comments: No neck swelling or Ludwig's angina Musculoskeletal:     Cervical back: Normal range of motion and neck supple.  Lymphadenopathy:     Cervical: No cervical adenopathy.  Skin:    General: Skin is warm and dry.  Neurological:     Mental Status: She is alert.     ED Results / Procedures / Treatments   Labs (  all labs ordered are listed, but only abnormal results are displayed) Labs Reviewed - No data to display  EKG None  Radiology No results found.  Procedures Procedures    Medications Ordered in ED Medications - No data to display  ED Course/ Medical Decision Making/ A&P    Patient seen and examined. History obtained directly from patient.   Labs/EKG: None ordered.  Imaging: None ordered.  Medications/Fluids: None ordered  Most recent vital signs reviewed and are as follows: BP (!) 168/89 (BP Location: Right Arm)   Pulse 78   Temp 98.1 F (36.7 C) (Oral)   Resp 16   Ht 5\' 6"  (1.676 m)   Wt 88.9 kg   SpO2 100%   BMI 31.64 kg/m   Initial impression: dental pain/dental infection  Plan: Discharge to home.   Prescriptions written for: Penicillin, # 6 tablets  oxycodone 5 mg, Diflucan  Other home care instructions discussed: Avoidance of chewing or other activities that makes the symptoms worse. Eat soft foods if needed and maintain good hydration.   ED return instructions discussed: Encouraged patient to return with worsening facial or neck swelling, difficulty breathing or swallowing, fever.   Follow-up instructions discussed: Patient encouraged to follow-up with provided dental referral, resources -- or their own dentist if able.                                 Medical Decision Making Risk Prescription drug management.   Patient presents for dental pain. They do not have a fever and do not appear septic. Exam unconcerning for Ludwig's angina or other deep tissue infection in neck and I do not feel that advanced imaging is indicated at this time. Low suspicion for PTA, RPA, epiglottis based on exam.   Patient will be treated for dental infection with antibiotic. Encouraged tylenol/NSAIDs as prescribed or as directed on the packaging for pain. #6 oxycodone given for more severe pain given complicated dental pain.  Encouraged follow-up with a dentist for definitive and long-term management.          Final Clinical Impression(s) / ED Diagnoses Final diagnoses:  Dental infection    Rx / DC Orders ED Discharge Orders          Ordered    penicillin v potassium (VEETID) 500 MG tablet  3 times daily        07/18/23 1135    oxyCODONE (OXY IR/ROXICODONE) 5 MG immediate release tablet  Every 6 hours PRN        07/18/23 1135    fluconazole (DIFLUCAN) 150 MG tablet  Daily        07/18/23 1135              Lyna Sandhoff, PA-C 07/18/23 1139    Albertus Hughs, DO 07/18/23 1142

## 2023-07-18 NOTE — ED Triage Notes (Signed)
 Pt c/o L dental pain and swelling to upper and lower teeth since yesterday. She reports having old dental fillings in the area that have fallen out.

## 2023-07-18 NOTE — ED Notes (Signed)

## 2023-07-18 NOTE — Discharge Instructions (Signed)
 Please read and follow all provided instructions.  Your diagnoses today include:  1. Dental infection     The exam and treatment you received today has been provided on an emergency basis only. This is not a substitute for complete medical or dental care.  Tests performed today include: Vital signs. See below for your results today.   Medications prescribed:  Penicillin - antibiotic  You have been prescribed an antibiotic medicine: take the entire course of medicine even if you are feeling better. Stopping early can cause the antibiotic not to work.  Oxycodone - narcotic pain medication  DO NOT drive or perform any activities that require you to be awake and alert because this medicine can make you drowsy.   Take any prescribed medications only as directed.  Home care instructions:  Follow any educational materials contained in this packet.  Follow-up instructions: Please follow-up with your dentist for further evaluation of your symptoms.   Dental Assistance: See attached dental referral and/or resource guide.   Return instructions:  Please return to the Emergency Department if you experience worsening symptoms. Please return if you develop a fever, you develop more swelling in your face or neck, you have trouble breathing or swallowing food. Please return if you have any other emergent concerns.  Additional Information:  Your vital signs today were: BP (!) 168/89 (BP Location: Right Arm)   Pulse 78   Temp 98.1 F (36.7 C) (Oral)   Resp 16   Ht 5\' 6"  (1.676 m)   Wt 88.9 kg   SpO2 100%   BMI 31.64 kg/m  If your blood pressure (BP) was elevated above 135/85 this visit, please have this repeated by your doctor within one month. --------------

## 2024-01-23 IMAGING — CR DG LUMBAR SPINE COMPLETE 4+V
5 series · 5 of 5 positions shown · non-contrast
Comparison: 07/08/2020

CLINICAL DATA: Back pain, right hip pain

EXAM:
LUMBAR SPINE - COMPLETE 4+ VIEW

[t l-spine a.p.]
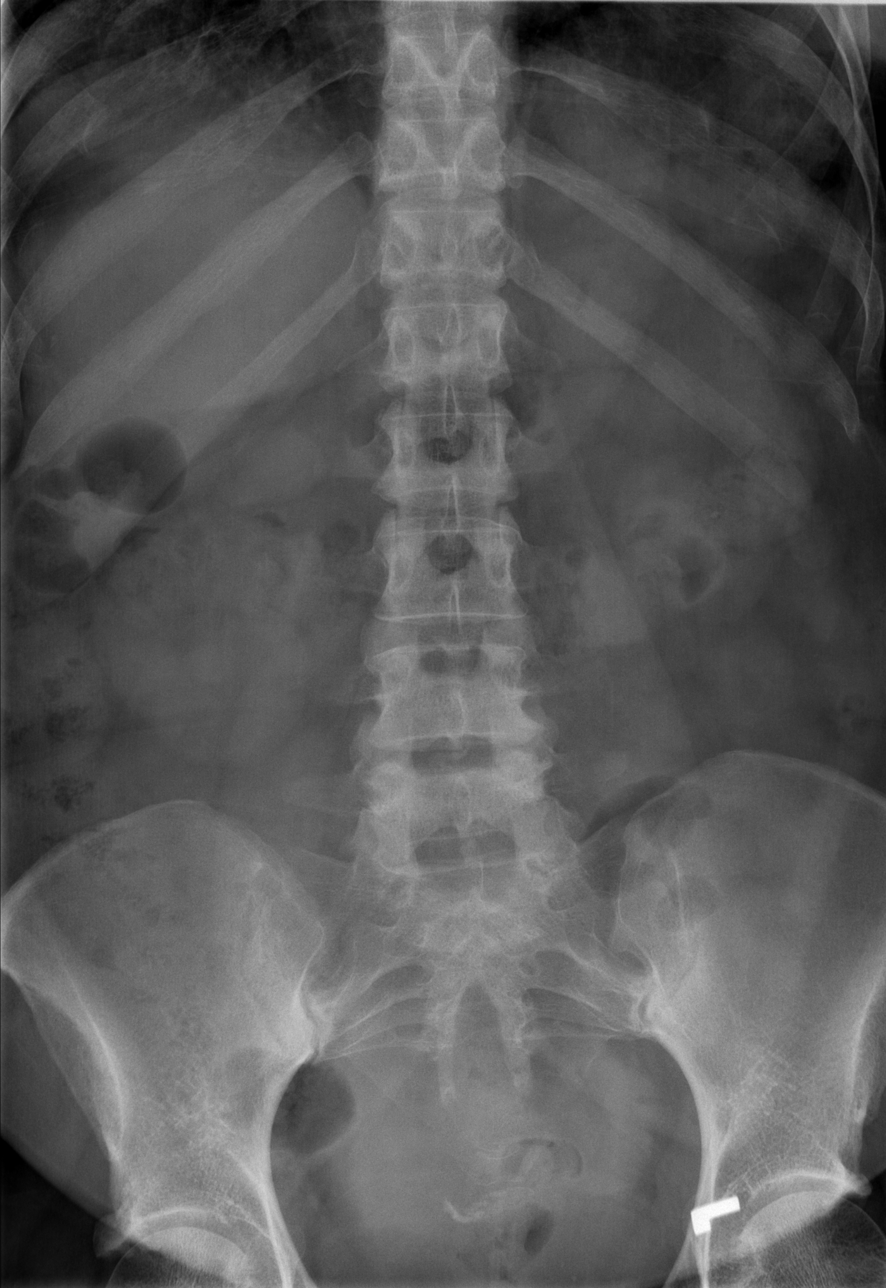

[t l-spine oblique exposure (1 of 2)]
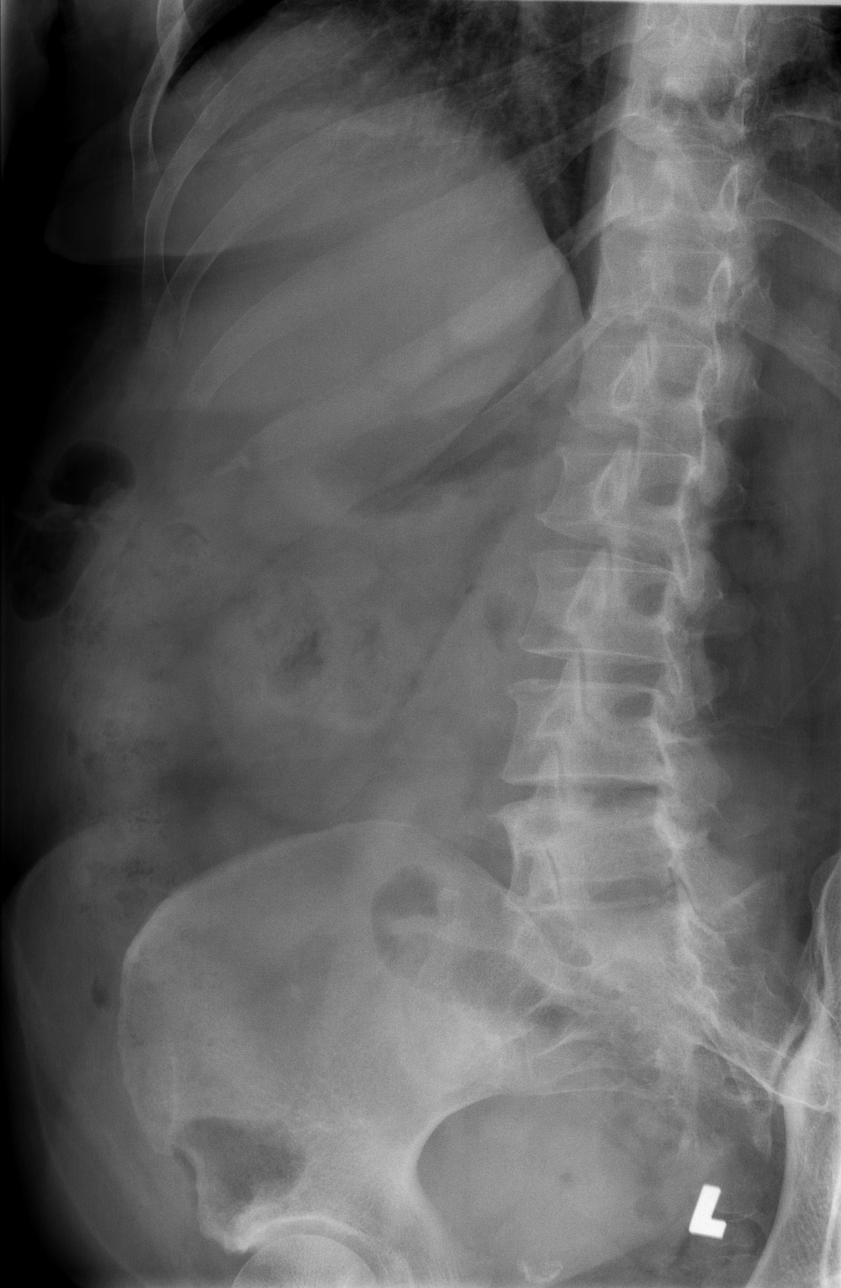

[t l-spine oblique exposure (2 of 2)]
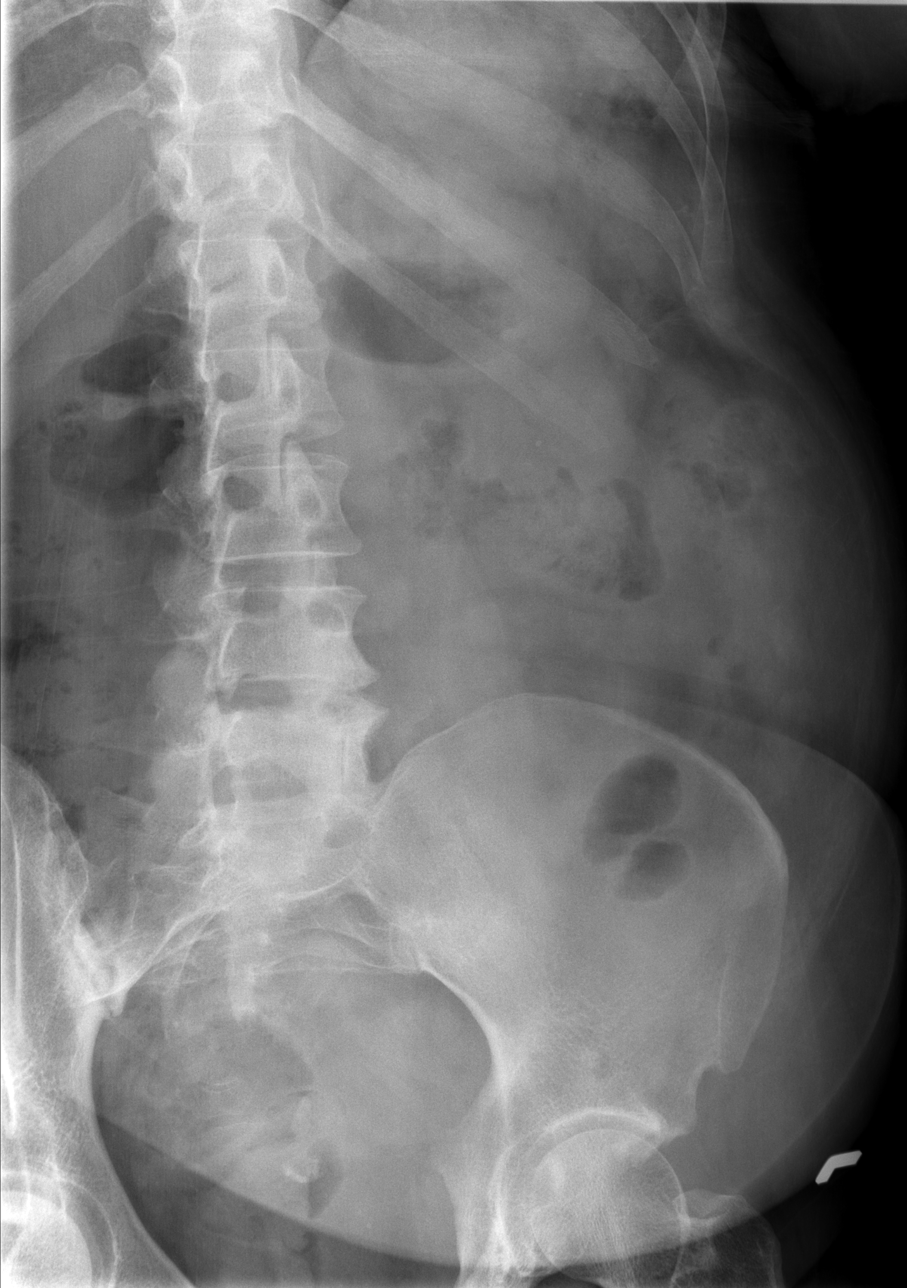

[t l-spine lat]
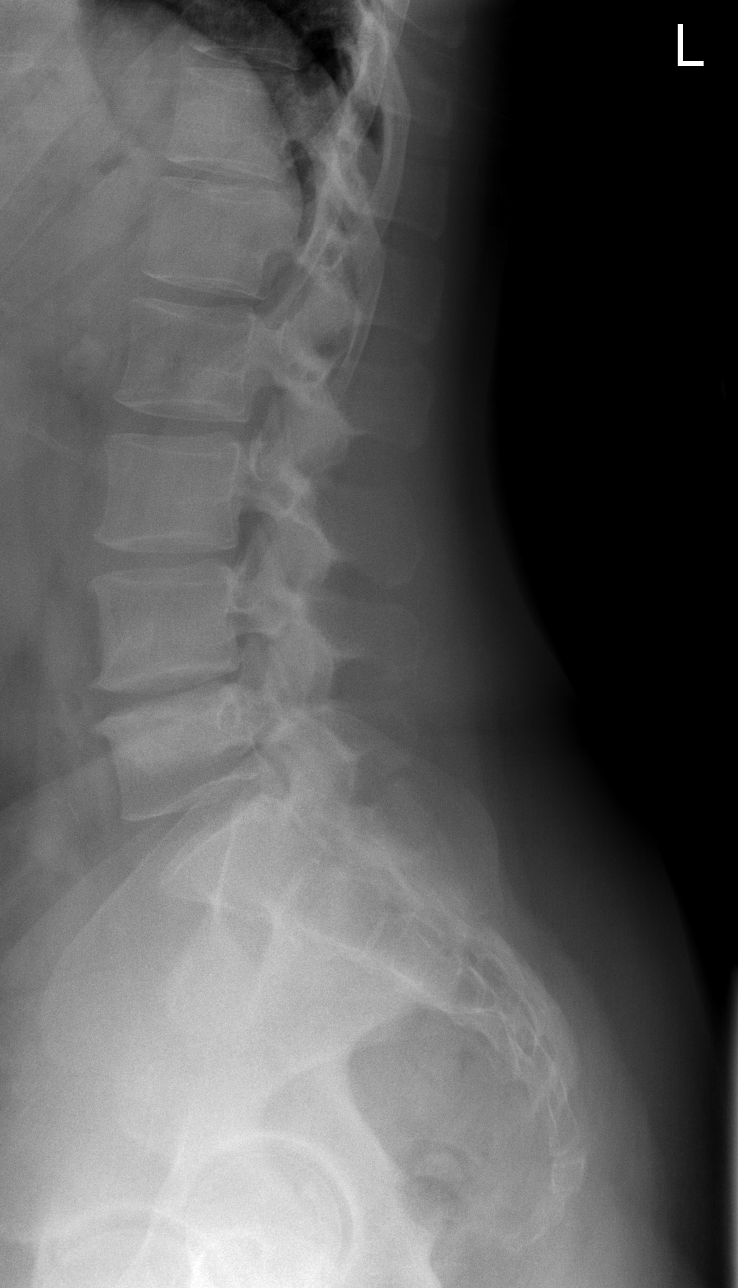

[t l-spine l5-s1 spot]
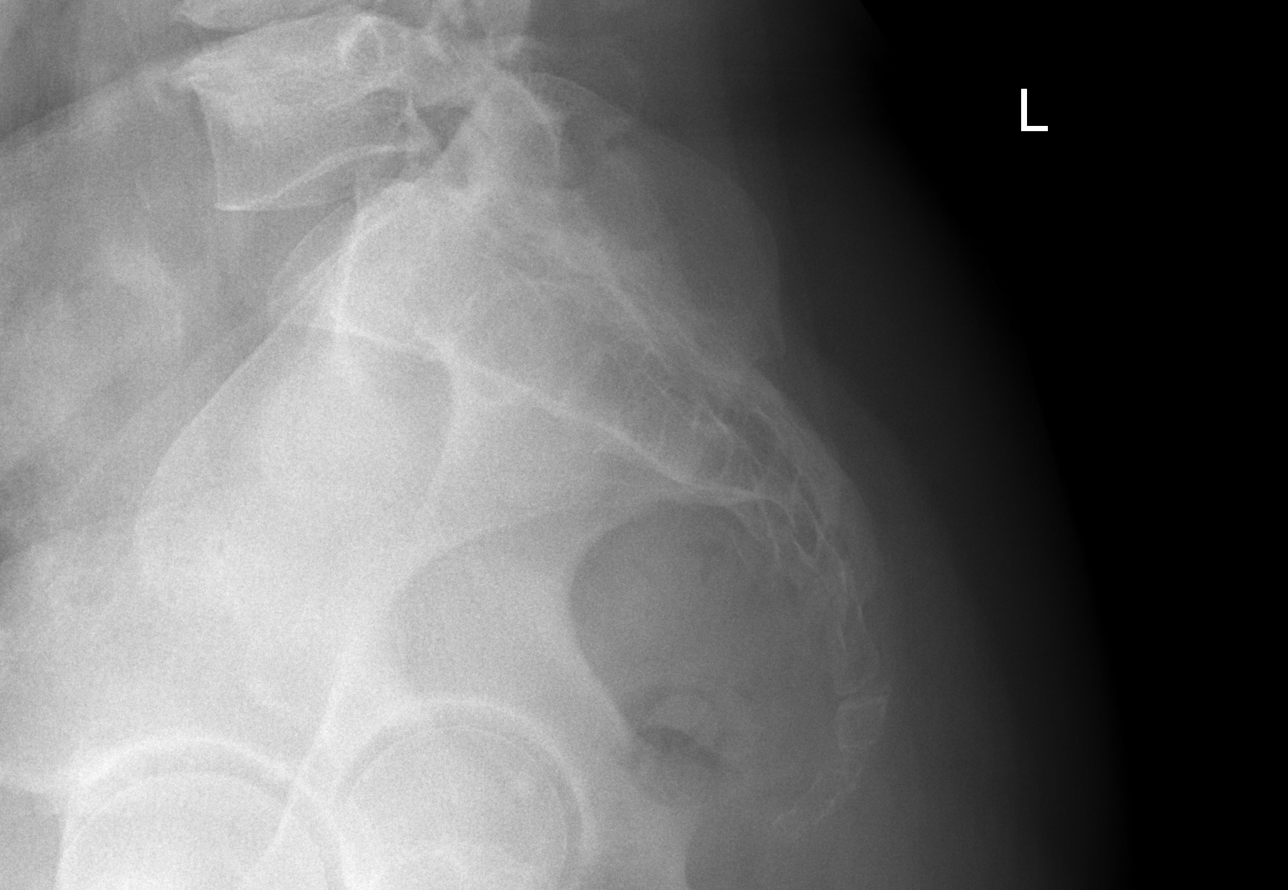

[5 of 5 positions shown; findings below may reference images not displayed]

FINDINGS: No recent fracture is seen. Alignment of posterior margins of
vertebral bodies is unremarkable. Marked degenerative changes are
noted at L4-L5 level with disc space narrowing, bony spurs and facet
hypertrophy. Facet hypertrophy is also noted at L5-S1 level.
Overall, no significant interval changes are noted.
IMPRESSION: No recent fracture is seen. Degenerative changes are noted in the
lumbar spine, particularly prominent at L4-L5 and L5-S1 levels.

## 2024-05-22 ENCOUNTER — Encounter
